# Patient Record
Sex: Female | Born: 1975 | Race: White | Hispanic: No | Marital: Married | Smoking: Never smoker
Health system: Southern US, Community
[De-identification: ages and names within clinical notes are randomized; demographics above are authoritative.]

## PROBLEM LIST (undated history)

## (undated) DIAGNOSIS — F32A Depression, unspecified: Secondary | ICD-10-CM

## (undated) DIAGNOSIS — F329 Major depressive disorder, single episode, unspecified: Secondary | ICD-10-CM

## (undated) DIAGNOSIS — G43909 Migraine, unspecified, not intractable, without status migrainosus: Secondary | ICD-10-CM

## (undated) DIAGNOSIS — D649 Anemia, unspecified: Secondary | ICD-10-CM

## (undated) DIAGNOSIS — E669 Obesity, unspecified: Secondary | ICD-10-CM

## (undated) DIAGNOSIS — J3089 Other allergic rhinitis: Secondary | ICD-10-CM

## (undated) DIAGNOSIS — R519 Headache, unspecified: Secondary | ICD-10-CM

## (undated) DIAGNOSIS — M549 Dorsalgia, unspecified: Secondary | ICD-10-CM

## (undated) DIAGNOSIS — J45909 Unspecified asthma, uncomplicated: Secondary | ICD-10-CM

## (undated) DIAGNOSIS — R569 Unspecified convulsions: Secondary | ICD-10-CM

## (undated) DIAGNOSIS — F319 Bipolar disorder, unspecified: Secondary | ICD-10-CM

## (undated) DIAGNOSIS — S32009A Unspecified fracture of unspecified lumbar vertebra, initial encounter for closed fracture: Secondary | ICD-10-CM

## (undated) DIAGNOSIS — R51 Headache: Secondary | ICD-10-CM

## (undated) DIAGNOSIS — F419 Anxiety disorder, unspecified: Secondary | ICD-10-CM

## (undated) DIAGNOSIS — R011 Cardiac murmur, unspecified: Secondary | ICD-10-CM

## (undated) DIAGNOSIS — G8929 Other chronic pain: Secondary | ICD-10-CM

## (undated) HISTORY — PX: GANGLION CYST EXCISION: SHX1691

## (undated) HISTORY — DX: Migraine, unspecified, not intractable, without status migrainosus: G43.909

## (undated) HISTORY — PX: COLONOSCOPY: SHX174

## (undated) HISTORY — PX: WISDOM TOOTH EXTRACTION: SHX21

## (undated) HISTORY — PX: UPPER GI ENDOSCOPY: SHX6162

## (undated) HISTORY — PX: CHOLECYSTECTOMY: SHX55

---

## 2000-07-23 ENCOUNTER — Other Ambulatory Visit: Admission: RE | Admit: 2000-07-23 | Discharge: 2000-07-23 | Payer: Self-pay | Admitting: Gynecology

## 2000-12-18 ENCOUNTER — Encounter: Payer: Self-pay | Admitting: Family Medicine

## 2000-12-18 ENCOUNTER — Encounter: Admission: RE | Admit: 2000-12-18 | Discharge: 2000-12-18 | Payer: Self-pay | Admitting: Family Medicine

## 2001-01-16 HISTORY — PX: ULNAR NERVE TRANSPOSITION: SHX2595

## 2001-01-24 ENCOUNTER — Encounter: Admission: RE | Admit: 2001-01-24 | Discharge: 2001-01-24 | Payer: Self-pay | Admitting: Family Medicine

## 2001-01-24 ENCOUNTER — Encounter: Payer: Self-pay | Admitting: Family Medicine

## 2001-04-08 ENCOUNTER — Ambulatory Visit (HOSPITAL_BASED_OUTPATIENT_CLINIC_OR_DEPARTMENT_OTHER): Admission: RE | Admit: 2001-04-08 | Discharge: 2001-04-08 | Payer: Self-pay | Admitting: Orthopedic Surgery

## 2001-06-24 ENCOUNTER — Other Ambulatory Visit: Admission: RE | Admit: 2001-06-24 | Discharge: 2001-06-24 | Payer: Self-pay | Admitting: Gynecology

## 2002-08-04 ENCOUNTER — Other Ambulatory Visit: Admission: RE | Admit: 2002-08-04 | Discharge: 2002-08-04 | Payer: Self-pay | Admitting: Obstetrics and Gynecology

## 2003-02-26 ENCOUNTER — Inpatient Hospital Stay (HOSPITAL_COMMUNITY): Admission: AD | Admit: 2003-02-26 | Discharge: 2003-02-28 | Payer: Self-pay | Admitting: Obstetrics and Gynecology

## 2003-04-17 ENCOUNTER — Other Ambulatory Visit: Admission: RE | Admit: 2003-04-17 | Discharge: 2003-04-17 | Payer: Self-pay | Admitting: Obstetrics and Gynecology

## 2005-05-05 ENCOUNTER — Inpatient Hospital Stay (HOSPITAL_COMMUNITY): Admission: AD | Admit: 2005-05-05 | Discharge: 2005-05-05 | Payer: Self-pay | Admitting: Obstetrics and Gynecology

## 2005-05-05 ENCOUNTER — Inpatient Hospital Stay (HOSPITAL_COMMUNITY): Admission: AD | Admit: 2005-05-05 | Discharge: 2005-05-06 | Payer: Self-pay | Admitting: Obstetrics & Gynecology

## 2005-09-14 ENCOUNTER — Encounter (HOSPITAL_COMMUNITY): Admission: AD | Admit: 2005-09-14 | Discharge: 2005-09-15 | Payer: Self-pay | Admitting: *Deleted

## 2005-10-23 ENCOUNTER — Inpatient Hospital Stay (HOSPITAL_COMMUNITY): Admission: AD | Admit: 2005-10-23 | Discharge: 2005-10-26 | Payer: Self-pay | Admitting: Obstetrics and Gynecology

## 2005-10-24 ENCOUNTER — Encounter (INDEPENDENT_AMBULATORY_CARE_PROVIDER_SITE_OTHER): Payer: Self-pay | Admitting: *Deleted

## 2007-01-17 HISTORY — PX: GASTRIC BYPASS: SHX52

## 2009-12-20 ENCOUNTER — Emergency Department (HOSPITAL_COMMUNITY)
Admission: EM | Admit: 2009-12-20 | Discharge: 2009-12-21 | Payer: Self-pay | Source: Home / Self Care | Admitting: Emergency Medicine

## 2010-06-03 NOTE — H&P (Signed)
NAME:  Kelli Barnett, Kelli Barnett                       ACCOUNT NO.:  [id number]   MEDICAL RECORD NO.:  [id number]                   PATIENT TYPE:  INP   LOCATION:  9167                                 FACILITY:  [facility]   PHYSICIAN:  Lenoard Aden, M.D.             DATE OF BIRTH:  06/06/1975   DATE OF ADMISSION:  02/26/2003  DATE OF DISCHARGE:                                HISTORY & PHYSICAL   CHIEF COMPLAINT:  Post dates.   HISTORY OF PRESENT ILLNESS:  The patient is a 35 year old white female, G1,  P0, EDD February 24, 2003, at 40+ weeks for post dates induction with  favorable cervix.   ALLERGIES:  The patient has allergy to NAPROSYN.   MEDICATIONS:  Prenatal vitamins.   PREVIOUS OBSTETRICAL HISTORY:  Noncontributory.   PAST MEDICAL HISTORY:  1. Previous history of epilepsy as a child.  Currently on no medications.  2. History of migraine headaches.   PAST SURGICAL HISTORY:  History of left elbow surgery.   FAMILY HISTORY:  History of depression, diabetes, and heart disease.   PRENATAL LABORATORY DATA:  Blood type O negative.  Rubella immune.  Hepatitis and HIV negative.   PHYSICAL EXAMINATION:  GENERAL APPEARANCE:  A well-developed white female in  no acute distress.  HEENT:  Normal.  LUNGS:  Clear.  HEART:  Regular rhythm.  ABDOMEN:  Soft, gravid, and nontender.  Estimated fetal weight 7-1/2 pounds.  PELVIC:  Cervix 2-3 cm, 2 cm long, vertex, and -1.   IMPRESSION:  Post dates intrauterine pregnancy for induction.   PLAN:  Proceed with induction.  The risks and benefits of expectant  management versus intervention were discussed.                                               Lenoard Aden, M.D.    RJT/MEDQ  D:  02/26/2003  T:  02/26/2003  Job:  [id number]

## 2010-06-03 NOTE — Op Note (Signed)
[place]  Patient:    JENNESSA, TRIGO Visit Number: [id number] MRN: [id number]          Service Type: DSU Location: [facility] Attending Physician:  Milly Jakob Dictated by:   Harvie Junior, M.D. Proc. Date: 04/08/01 Admit Date:  04/08/2001                             Operative Report  PREOPERATIVE DIAGNOSIS:  Ulnar nerve compression, left elbow.  POSTOPERATIVE DIAGNOSIS:  Ulnar nerve compression, left elbow.  OPERATION PERFORMED:  Left ulnar nerve decompression.  SURGEON:  Harvie Junior, M.D.  ASSISTANT:  Currie Paris. Thedore Mins.  ANESTHESIA:  General.  INDICATIONS FOR PROCEDURE:  The patient is a 35 year old female with a long history of having numbness and tingling in her little and ring finger.  She ultimately had failed conservative care and had EMGs performed which showed that she had ulnar nerve compression at the elbow.  Because of failure of conservative care and nerve conduction studies which were positive for ulnar nerve compression, the patient was ultimately evaluated at our office and felt to have ulnar nerve compression at the elbow.  We talked about treatment options including long arm night time splinting versus anti-inflammatory medication, injection therapy.  She really felt that these things were not going to help and that surgical intervention was appropriate.  She was brought to the operating room for that procedure.  DESCRIPTION OF PROCEDURE:  The patient was brought to the operating room and after adequate anesthesia was obtained with a general anesthetic, the patient was placed supine on the operating table.  The left arm was prepped and draped in the usual sterile fashion.  Following Esmarch exsanguination of the extremity, a blood pressure tourniquet was inflated to 250 mHg.  Following this, a linear incision was made over the ulnar nerve with subcutaneous tissues dissected down to the level of the ulnar  nerve which was clearly identified and then dissected both proximally and distally.  Once the nerve was identified and dissected both proximally and distally, the Sewell retractor was put in place proximally and the nerve was decompressed for a 10 cm stretch proximally.  Following the decompression, attention was turned towards distally where the ulnar nerve was identified and decompressed for about 10 cm distally to the elbow.  This was through the flexor carpi ulnaris where the fascia was divided above and the ulnar nerve tunnel was opened at this point.  Following this, the nerve was identified and freed up of all compression.  The nerve was not dissected free and the vessels with the nerve were left undisturbed.  At this point attention was turned to the medial epicondyle where the prominent medial epicondyle was taken down with a subperiosteal dissection.  A medial epicondylectomy was performed.  Once the medial epicondylectomy was performed, the ulnar nerve no longer had compression when the elbow was put through a range of motion.  At this point the wound was copiously irrigated and suctioned dry.  The medial musculature was then closed over the subperiosteal dissection and where the bone had been removed.  At that point the elbow was put through a range of motion and there was no evidence of compression after closure of this muscle fascia.  At this point the wound was copiously irrigated.  The deep subcutaneous was closed with 0 Vicryl, the subcutaneous with 2-0 Vicryl and the skin with 3-0  Maxon pull-out suture.  Benzoin and Steri-Strips were applied.  Sterile compressive dressing was applied.  The patient was transferred to the recovery room where she was noted to be in satisfactory condition.  Estimated blood loss for this procedure was none. Dictated by:   Harvie Junior, M.D. Attending Physician:  Milly Jakob DD:  04/08/01 TD:  04/09/01 Job: [id number]

## 2010-06-03 NOTE — H&P (Signed)
Kelli Barnett, Kelli Barnett             ACCOUNT NO.:  [id number]   MEDICAL RECORD NO.:  [id number]          PATIENT TYPE:  INP   LOCATION:  9174                          FACILITY:  [facility]   PHYSICIAN:  Lenoard Aden, M.D.DATE OF BIRTH:  [birth year]   DATE OF ADMISSION:  10/23/2005  DATE OF DISCHARGE:                                HISTORY & PHYSICAL   CHIEF COMPLAINT:  Oligohydramnios with history of second and third trimester  bleeding, for cervical ripening and induction.   PAST MEDICAL HISTORY:  The patient is a 35 year old white female, G2, P1, at  [redacted] weeks gestation who presents for induction for the aforementioned  reasons.  She has a history during this pregnancy of a large subchorionic  hemorrhagic with subsequent resolution, history of placenta previa with  resolution, history of group B strep positivity, history of unexplained  oligohydramnios with adequate growth, normal targeted ultrasound.  She did  receive betamethasone in the early third trimester.  She has had reassuring  fetal heart rate surveillance and biophysical profiles twice weekly.   ALLERGIES:  NAPROSYN.   FAMILY HISTORY:  Myocardial infarction, chronic hypertension, polycythemia,  bronchitis, breast cancer, leukemia, seizure disorder, migraine headaches,  depression, nicotine dependence, alcohol abuse.   PAST MEDICAL HISTORY:  She has a personal history of:  1. Arm surgery.  2. Anemia as a child.  3. Childhood seizure disorder.  4. Migraine headaches.   SOCIAL HISTORY:  She is a nonsmoker, nondrinker.  She denies domestic or  physical violence.   OBSTETRIC HISTORY:  History of uncomplicated vaginal delivery of  7 pound,  10 ounce female vaginally without complication in 2005.   PHYSICAL EXAMINATION:  GENERAL:  She is a well-developed, well-nourished  white female in no acute distress.  HEENT:  Normal.  LUNGS:  Clear.  HEART:  Rhythm regular.  ABDOMEN:  Soft, gravid, nontender.  Cervix is closed, 50%,  vertex, -1.  EXTREMITIES:  No cords.  NEUROLOGIC:  Nonfocal.   IMPRESSION:  1. Thirty-seven week OB.  2. Unexplained oligohydramnios.  3. History of second and third trimester bleeding.  4. GBS positivity.   PLAN:  Cervidil placed.  NST reactive.  Will proceed with cervical ripening  and induction.      Lenoard Aden, M.D.  Electronically Signed     RJT/MEDQ  D:  10/23/2005  T:  10/23/2005  Job:  [id number]

## 2012-02-29 ENCOUNTER — Encounter (HOSPITAL_BASED_OUTPATIENT_CLINIC_OR_DEPARTMENT_OTHER): Payer: Self-pay | Admitting: Emergency Medicine

## 2012-02-29 ENCOUNTER — Emergency Department (HOSPITAL_BASED_OUTPATIENT_CLINIC_OR_DEPARTMENT_OTHER): Payer: BC Managed Care – PPO

## 2012-02-29 ENCOUNTER — Emergency Department (HOSPITAL_BASED_OUTPATIENT_CLINIC_OR_DEPARTMENT_OTHER)
Admission: EM | Admit: 2012-02-29 | Discharge: 2012-02-29 | Disposition: A | Discharge status: Home / Self Care | Payer: BC Managed Care – PPO | Attending: Emergency Medicine | Admitting: Emergency Medicine

## 2012-02-29 DIAGNOSIS — F3289 Other specified depressive episodes: Secondary | ICD-10-CM | POA: Insufficient documentation

## 2012-02-29 DIAGNOSIS — Z79899 Other long term (current) drug therapy: Secondary | ICD-10-CM | POA: Insufficient documentation

## 2012-02-29 DIAGNOSIS — X500XXA Overexertion from strenuous movement or load, initial encounter: Secondary | ICD-10-CM | POA: Insufficient documentation

## 2012-02-29 DIAGNOSIS — Z8781 Personal history of (healed) traumatic fracture: Secondary | ICD-10-CM | POA: Insufficient documentation

## 2012-02-29 DIAGNOSIS — Y9389 Activity, other specified: Secondary | ICD-10-CM | POA: Insufficient documentation

## 2012-02-29 DIAGNOSIS — Z9884 Bariatric surgery status: Secondary | ICD-10-CM | POA: Insufficient documentation

## 2012-02-29 DIAGNOSIS — Y929 Unspecified place or not applicable: Secondary | ICD-10-CM | POA: Insufficient documentation

## 2012-02-29 DIAGNOSIS — S8990XA Unspecified injury of unspecified lower leg, initial encounter: Secondary | ICD-10-CM | POA: Insufficient documentation

## 2012-02-29 DIAGNOSIS — M79673 Pain in unspecified foot: Secondary | ICD-10-CM

## 2012-02-29 DIAGNOSIS — F329 Major depressive disorder, single episode, unspecified: Secondary | ICD-10-CM | POA: Insufficient documentation

## 2012-02-29 HISTORY — DX: Unspecified fracture of unspecified lumbar vertebra, initial encounter for closed fracture: S32.009A

## 2012-02-29 HISTORY — DX: Depression, unspecified: F32.A

## 2012-02-29 HISTORY — DX: Major depressive disorder, single episode, unspecified: F32.9

## 2012-02-29 MED ORDER — HYDROCODONE-ACETAMINOPHEN 5-325 MG PO TABS: 1.0000 | ORAL_TABLET | ORAL | Status: DC | PRN

## 2012-02-29 NOTE — ED Provider Notes (Signed)
History     CSN: [id number]  Arrival date & time 02/29/12  1228   First MD Initiated Contact with Patient 02/29/12 1240      Chief Complaint  Patient presents with  . Foot Pain    (Consider location/radiation/quality/duration/timing/severity/associated sxs/prior treatment) HPI Comments: Pt states that she twisted foot and now she is having pain and swelling to the right lateral foot  Patient is a 37 y.o. female presenting with lower extremity pain. The history is provided by the patient. No language interpreter was used.  Foot Pain This is a new problem. The current episode started today. The problem occurs constantly. The problem has been unchanged. The symptoms are aggravated by walking. She has tried nothing for the symptoms.    Past Medical History  Diagnosis Date  . Depression   . Lumbar vertebral fracture     Past Surgical History  Procedure Laterality Date  . Gastric bypass  2009  . Ulnar nerve transposition    . Cholecystectomy      History reviewed. No pertinent family history.  History  Substance Use Topics  . Smoking status: Never Smoker   . Smokeless tobacco: Not on file  . Alcohol Use: Yes     Comment: occassional    OB History   Grav Para Term Preterm Abortions TAB SAB Ect Mult Living                  Review of Systems  Constitutional: Negative.   Respiratory: Negative.   Cardiovascular: Negative.     Allergies  Oxycodone and Naproxen  Home Medications   Current Outpatient Rx  Name  Route  Sig  Dispense  Refill  . ARIPiprazole (ABILIFY) 20 MG tablet   Oral   Take 10 mg by mouth daily.         . norgestrel-ethinyl estradiol (LO/OVRAL,CRYSELLE) 0.3-30 MG-MCG tablet   Oral   Take 1 tablet by mouth daily.         . Vilazodone HCl (VIIBRYD) 40 MG TABS   Oral   Take 40 mg by mouth daily.           BP 143/88  Pulse 86  Temp(Src) 98.3 F (36.8 C)  Resp 16  Ht 5\' 10"  (1.778 m)  Wt 240 lb (108.863 kg)  BMI 34.44 kg/m2   SpO2 100%  LMP 02/22/2012  Physical Exam  Vitals reviewed. Constitutional: She is oriented to person, place, and time. She appears well-developed and well-nourished.  Cardiovascular: Normal rate and regular rhythm.   Pulmonary/Chest: Effort normal and breath sounds normal.  Musculoskeletal:  Pt has swelling along the right lateral foot:pt tender along the fifth metatarsal:pulses intact  Neurological: She is alert and oriented to person, place, and time.  Skin: Skin is warm and dry.  Psychiatric: She has a normal mood and affect.    ED Course  Procedures (including critical care time)  Labs Reviewed - No data to display Dg Foot Complete Right  02/29/2012  *RADIOLOGY REPORT*  Clinical Data: Pain post fall  RIGHT FOOT COMPLETE - 3+ VIEW  Comparison: None.  Findings: Three views of the right foot submitted.  No acute fracture or subluxation.  No radiopaque foreign body.  IMPRESSION: No acute fracture or subluxation.   Original Report Authenticated By: Natasha Mead, M.D.      1. Foot pain       MDM  Pt splinted and given crutches for a possible metatarsal fracture  Teressa Lower, NP 02/29/12 1354

## 2012-02-29 NOTE — ED Provider Notes (Signed)
History/physical exam/procedure(s) were performed by non-physician practitioner and as supervising physician I was immediately available for consultation/collaboration. I have reviewed all notes and am in agreement with care and plan.   Hilario Quarry, MD 02/29/12 [phone]

## 2012-02-29 NOTE — ED Notes (Signed)
Patient transported to X-ray 

## 2012-02-29 NOTE — ED Notes (Signed)
Pt's right foot fell asleep while sitting, stood and "twisted" foot/ankle. Pt has lateral swelling and ecchymosis to dorsum of foot and reports pain in foot that is worse with ambulation.

## 2013-01-16 HISTORY — PX: BACK SURGERY: SHX140

## 2013-04-16 ENCOUNTER — Encounter (HOSPITAL_BASED_OUTPATIENT_CLINIC_OR_DEPARTMENT_OTHER): Payer: Self-pay | Admitting: Emergency Medicine

## 2013-04-16 ENCOUNTER — Emergency Department (HOSPITAL_BASED_OUTPATIENT_CLINIC_OR_DEPARTMENT_OTHER): Payer: BC Managed Care – PPO

## 2013-04-16 ENCOUNTER — Emergency Department (HOSPITAL_BASED_OUTPATIENT_CLINIC_OR_DEPARTMENT_OTHER)
Admission: EM | Admit: 2013-04-16 | Discharge: 2013-04-16 | Disposition: A | Discharge status: Home / Self Care | Payer: BC Managed Care – PPO | Attending: Emergency Medicine | Admitting: Emergency Medicine

## 2013-04-16 DIAGNOSIS — R51 Headache: Secondary | ICD-10-CM

## 2013-04-16 DIAGNOSIS — Z8781 Personal history of (healed) traumatic fracture: Secondary | ICD-10-CM | POA: Insufficient documentation

## 2013-04-16 DIAGNOSIS — R519 Headache, unspecified: Secondary | ICD-10-CM

## 2013-04-16 DIAGNOSIS — F329 Major depressive disorder, single episode, unspecified: Secondary | ICD-10-CM | POA: Insufficient documentation

## 2013-04-16 DIAGNOSIS — Z79899 Other long term (current) drug therapy: Secondary | ICD-10-CM | POA: Insufficient documentation

## 2013-04-16 DIAGNOSIS — R200 Anesthesia of skin: Secondary | ICD-10-CM

## 2013-04-16 DIAGNOSIS — H538 Other visual disturbances: Secondary | ICD-10-CM | POA: Insufficient documentation

## 2013-04-16 DIAGNOSIS — R209 Unspecified disturbances of skin sensation: Secondary | ICD-10-CM | POA: Insufficient documentation

## 2013-04-16 DIAGNOSIS — G43109 Migraine with aura, not intractable, without status migrainosus: Secondary | ICD-10-CM | POA: Insufficient documentation

## 2013-04-16 DIAGNOSIS — R0789 Other chest pain: Secondary | ICD-10-CM | POA: Insufficient documentation

## 2013-04-16 DIAGNOSIS — F3289 Other specified depressive episodes: Secondary | ICD-10-CM | POA: Insufficient documentation

## 2013-04-16 DIAGNOSIS — Z3202 Encounter for pregnancy test, result negative: Secondary | ICD-10-CM | POA: Insufficient documentation

## 2013-04-16 LAB — PREGNANCY, URINE: Preg Test, Ur: NEGATIVE

## 2013-04-16 LAB — BASIC METABOLIC PANEL
BUN: 8 mg/dL (ref 6–23)
CO2: 25 mEq/L (ref 19–32)
Calcium: 9 mg/dL (ref 8.4–10.5)
Chloride: 102 mEq/L (ref 96–112)
Creatinine, Ser: 0.6 mg/dL (ref 0.50–1.10)
GFR calc Af Amer: >90 mL/min (ref >90)
GFR calc non Af Amer: >90 mL/min (ref >90)
Glucose, Bld: 98 mg/dL (ref 70–99)
Potassium: 3.7 mEq/L (ref 3.7–5.3)
Sodium: 139 mEq/L (ref 137–147)

## 2013-04-16 LAB — CBC WITH DIFFERENTIAL/PLATELET
Basophils Absolute: 0.1 10*3/uL (ref 0.0–0.1)
Basophils Relative: 1 % (ref 0–1)
Eosinophils Absolute: 0.1 10*3/uL (ref 0.0–0.7)
Eosinophils Relative: 2 % (ref 0–5)
HCT: 32.6 % — ABNORMAL LOW (ref 36.0–46.0)
Hemoglobin: 10.2 g/dL — ABNORMAL LOW (ref 12.0–15.0)
Lymphocytes Relative: 30 % (ref 12–46)
Lymphs Abs: 2 10*3/uL (ref 0.7–4.0)
MCH: 27.3 pg (ref 26.0–34.0)
MCHC: 31.3 g/dL (ref 30.0–36.0)
MCV: 87.4 fL (ref 78.0–100.0)
Monocytes Absolute: 0.6 10*3/uL (ref 0.1–1.0)
Monocytes Relative: 9 % (ref 3–12)
Neutro Abs: 3.9 10*3/uL (ref 1.7–7.7)
Neutrophils Relative %: 59 % (ref 43–77)
Platelets: 406 10*3/uL — ABNORMAL HIGH (ref 150–400)
RBC: 3.73 MIL/uL — ABNORMAL LOW (ref 3.87–5.11)
RDW: 17 % — ABNORMAL HIGH (ref 11.5–15.5)
WBC: 6.6 10*3/uL (ref 4.0–10.5)

## 2013-04-16 LAB — URINALYSIS, ROUTINE W REFLEX MICROSCOPIC
Bilirubin Urine: NEGATIVE
Glucose, UA: NEGATIVE mg/dL
Hgb urine dipstick: NEGATIVE
Ketones, ur: 15 mg/dL — AB
Nitrite: NEGATIVE
Protein, ur: NEGATIVE mg/dL
Specific Gravity, Urine: 1.019 (ref 1.005–1.030)
Urobilinogen, UA: 0.2 mg/dL (ref 0.0–1.0)
pH: 6.5 (ref 5.0–8.0)

## 2013-04-16 LAB — URINE MICROSCOPIC-ADD ON

## 2013-04-16 MED ORDER — DIPHENHYDRAMINE HCL 50 MG/ML IJ SOLN
25.0000 mg | Freq: Once | INTRAMUSCULAR | Status: AC
  Administered 2013-04-16: 25 mg via INTRAVENOUS
  Filled 2013-04-16: qty 1

## 2013-04-16 MED ORDER — METOCLOPRAMIDE HCL 5 MG/ML IJ SOLN
10.0000 mg | Freq: Once | INTRAMUSCULAR | Status: AC
  Administered 2013-04-16: 10 mg via INTRAVENOUS
  Filled 2013-04-16: qty 2

## 2013-04-16 MED ORDER — DEXAMETHASONE SODIUM PHOSPHATE 10 MG/ML IJ SOLN
10.0000 mg | Freq: Once | INTRAMUSCULAR | Status: AC
  Administered 2013-04-16: 10 mg via INTRAVENOUS
  Filled 2013-04-16: qty 1

## 2013-04-16 NOTE — ED Provider Notes (Addendum)
CSN: [id number]     Arrival date & time 04/16/13  1606 History  This chart was scribed for Gwyneth Sprout, MD by Charline Bills, ED Scribe. The patient was seen in room MH12/MH12. Patient's care was started at 6:21 PM.   Chief Complaint  Patient presents with  . Numbness    The history is provided by the patient. No language interpreter was used.   HPI Comments: Kelli Barnett is a 38 y.o. female who presents to the Emergency Department complaining of intermittent numbness in her left hand that radiates to her left shoulder onset 2 weeks ago. She states that this has occurred approximately 6 times over the past 2 weeks and usually would last about 1 hour but today started at 11am and has not resolved over the last 5.5 hours.  She has had a headache every time these sx have occurred. She also reports associated headaches that she rates 4/10 sometimes and describes as "stabbing" at other times. She also reports blurred vision but denies double vision.  No lower ext involvement. She denies chest pain and shortness of breath, but reports some numbness in her left shoulder area.  No weakness. LNMP 04/04/13.   Past Medical History  Diagnosis Date  . Depression   . Lumbar vertebral fracture    Past Surgical History  Procedure Laterality Date  . Gastric bypass  2009  . Ulnar nerve transposition    . Cholecystectomy     History reviewed. No pertinent family history. History  Substance Use Topics  . Smoking status: Never Smoker   . Smokeless tobacco: Not on file  . Alcohol Use: Yes     Comment: occassional   OB History   Grav Para Term Preterm Abortions TAB SAB Ect Mult Living                 Review of Systems  Eyes: Positive for visual disturbance.  Respiratory: Positive for chest tightness. Negative for shortness of breath.   Cardiovascular: Negative for chest pain.  Neurological: Positive for numbness and headaches.  All other systems reviewed and are negative.    Allergies   Tussionex pennkinetic er and Naproxen  Home Medications   Current Outpatient Rx  Name  Route  Sig  Dispense  Refill  . ARIPiprazole (ABILIFY) 20 MG tablet   Oral   Take 10 mg by mouth daily.         Marland Kitchen HYDROcodone-acetaminophen (NORCO/VICODIN) 5-325 MG per tablet   Oral   Take 1-2 tablets by mouth every 4 (four) hours as needed for pain.   15 tablet   0   . norgestrel-ethinyl estradiol (LO/OVRAL,CRYSELLE) 0.3-30 MG-MCG tablet   Oral   Take 1 tablet by mouth daily.         . Vilazodone HCl (VIIBRYD) 40 MG TABS   Oral   Take 40 mg by mouth daily.          Triage Vitals: BP 180/103  Temp(Src) 97.9 F (36.6 C) (Oral)  Resp 16  Ht 5\' 9"  (1.753 m)  Wt 240 lb (108.863 kg)  BMI 35.43 kg/m2  SpO2 100%  LMP 04/04/2013  Physical Exam  Nursing note and vitals reviewed. Constitutional: She appears well-developed and well-nourished. No distress.  HENT:  Head: Normocephalic and atraumatic.  Eyes: Conjunctivae and EOM are normal. Pupils are equal, round, and reactive to light.  photophobia  Neck: Neck supple.  No carotid bruits   Cardiovascular: Normal rate, normal heart sounds and intact distal pulses.  No murmur heard. Pulmonary/Chest: Breath sounds normal. No respiratory distress. She has no wheezes. She has no rales.  Abdominal: Soft.  Neurological: She is alert. She has normal strength. A sensory deficit is present. Coordination and gait normal.  Decreases sensation to touch over the L facial nerve and L upper extremity  5/5 strength in all 4 extremities No facial droop Normal speech  No swallowing issues  Skin: She is not diaphoretic.    ED Course  Procedures (including critical care time) DIAGNOSTIC STUDIES: Oxygen Saturation is 100% on RA, normal by my interpretation.    COORDINATION OF CARE: 6:29 PM-Discussed treatment plan pt at bedside and pt agreed to plan.   Labs Review Labs Reviewed  CBC WITH DIFFERENTIAL - Abnormal; Notable for the following:     RBC 3.73 (*)    Hemoglobin 10.2 (*)    HCT 32.6 (*)    RDW 17.0 (*)    Platelets 406 (*)    All other components within normal limits  URINALYSIS, ROUTINE W REFLEX MICROSCOPIC - Abnormal; Notable for the following:    APPearance CLOUDY (*)    Ketones, ur 15 (*)    Leukocytes, UA SMALL (*)    All other components within normal limits  URINE MICROSCOPIC-ADD ON - Abnormal; Notable for the following:    Squamous Epithelial / LPF FEW (*)    Bacteria, UA MANY (*)    All other components within normal limits  BASIC METABOLIC PANEL  PREGNANCY, URINE   Imaging Review Ct Head Wo Contrast  04/16/2013   CLINICAL DATA:  Intermittent numbness in left hand.  Blurry vision.  EXAM: CT HEAD WITHOUT CONTRAST  TECHNIQUE: Contiguous axial images were obtained from the base of the skull through the vertex without intravenous contrast.  COMPARISON:  None.  FINDINGS: There is no evidence for acute hemorrhage, hydrocephalus, mass lesion, or abnormal extra-axial fluid collection. No definite CT evidence for acute infarction. The visualized paranasal sinuses and mastoid air cells are clear.  IMPRESSION: Normal exam.   Electronically Signed   By: Kennith Center M.D.   On: 04/16/2013 20:22     EKG Interpretation   Date/Time:  Wednesday April 16 2013 16:43:29 EDT Ventricular Rate:  86 PR Interval:  160 QRS Duration: 98 QT Interval:  388 QTC Calculation: 464 R Axis:   32 Text Interpretation:  Normal sinus rhythm Normal ECG No previous tracing  Confirmed by Anitra Lauth  MD, Alphonzo Lemmings ([id number]) on 04/16/2013 6:18:48 PM      MDM   Final diagnoses:  Headache  Numbness  Complicated migraine    Patient with symptoms concerning for possible complex migraine versus stroke versus intracranial lesion versus bleed. For the last 2 weeks she's had intermittent symptoms that have been prolonged today. The symptoms are always associated with the headache. She has decreased sensation over the area specified in the physical  exam but no weakness noted on exam.  Patient seen in urgent care today and sent here. She is photophobic and is complaining of a headache. Prior history of migraines but no history of complex migraines. Patient given a headache cocktail. Initial blood pressure was elevated at 180/103 and patient states usually she has low blood pressure. We'll recheck. EKG within normal limits. Head CT, CBC, BMP, UA, UPT pending  7:59 PM Repeat BP was 127/55 without intervention and feel first number was an error.   9:12 PM Labs and Ct neg.  Pt sx are much improved after HA cocktail.  Pt to f/u with  neuro or her PCP if sx continue for MRI.  I personally performed the services described in this documentation, which was scribed in my presence.  The recorded information has been reviewed and considered.    Gwyneth SproutWhitney Azilee Pirro, MD 04/16/13 2112  Gwyneth SproutWhitney Tarrin Menn, MD 04/16/13 2115

## 2013-04-16 NOTE — ED Notes (Signed)
[facility]. Pt reports intermittent left hand numbness radiating to left shoulder, left neck, left jaw, through left eye, all starting 2 weeks ago. Went to [facility] today d/t increased pain to left temporal area. Pt reports headache. Tightness across left chest. Denies fever chills, n/v/d.

## 2013-04-16 NOTE — ED Notes (Signed)
Pt denies urge to void at this time.

## 2014-01-04 ENCOUNTER — Encounter (HOSPITAL_BASED_OUTPATIENT_CLINIC_OR_DEPARTMENT_OTHER): Payer: Self-pay | Admitting: *Deleted

## 2014-01-04 ENCOUNTER — Emergency Department (HOSPITAL_BASED_OUTPATIENT_CLINIC_OR_DEPARTMENT_OTHER)
Admission: EM | Admit: 2014-01-04 | Discharge: 2014-01-04 | Disposition: A | Discharge status: Home / Self Care | Payer: BC Managed Care – PPO | Attending: Emergency Medicine | Admitting: Emergency Medicine

## 2014-01-04 DIAGNOSIS — M5441 Lumbago with sciatica, right side: Secondary | ICD-10-CM | POA: Insufficient documentation

## 2014-01-04 DIAGNOSIS — Z79899 Other long term (current) drug therapy: Secondary | ICD-10-CM | POA: Diagnosis not present

## 2014-01-04 DIAGNOSIS — Z3202 Encounter for pregnancy test, result negative: Secondary | ICD-10-CM | POA: Insufficient documentation

## 2014-01-04 DIAGNOSIS — Z7952 Long term (current) use of systemic steroids: Secondary | ICD-10-CM | POA: Insufficient documentation

## 2014-01-04 DIAGNOSIS — R112 Nausea with vomiting, unspecified: Secondary | ICD-10-CM | POA: Diagnosis present

## 2014-01-04 DIAGNOSIS — Z8781 Personal history of (healed) traumatic fracture: Secondary | ICD-10-CM | POA: Insufficient documentation

## 2014-01-04 LAB — BASIC METABOLIC PANEL
Anion gap: 16 — ABNORMAL HIGH (ref 5–15)
BUN: 8 mg/dL (ref 6–23)
CO2: 24 mEq/L (ref 19–32)
Calcium: 8.8 mg/dL (ref 8.4–10.5)
Chloride: 99 mEq/L (ref 96–112)
Creatinine, Ser: 0.7 mg/dL (ref 0.50–1.10)
GFR calc Af Amer: >90 mL/min (ref >90)
GFR calc non Af Amer: >90 mL/min (ref >90)
Glucose, Bld: 94 mg/dL (ref 70–99)
Potassium: 4.2 mEq/L (ref 3.7–5.3)
Sodium: 139 mEq/L (ref 137–147)

## 2014-01-04 LAB — CBC WITH DIFFERENTIAL/PLATELET
Basophils Absolute: 0 10*3/uL (ref 0.0–0.1)
Basophils Relative: 1 % (ref 0–1)
Eosinophils Absolute: 0 10*3/uL (ref 0.0–0.7)
Eosinophils Relative: 1 % (ref 0–5)
HCT: 34.2 % — ABNORMAL LOW (ref 36.0–46.0)
Hemoglobin: 10.2 g/dL — ABNORMAL LOW (ref 12.0–15.0)
Lymphocytes Relative: 30 % (ref 12–46)
Lymphs Abs: 2.2 10*3/uL (ref 0.7–4.0)
MCH: 23.4 pg — ABNORMAL LOW (ref 26.0–34.0)
MCHC: 29.8 g/dL — ABNORMAL LOW (ref 30.0–36.0)
MCV: 78.6 fL (ref 78.0–100.0)
Monocytes Absolute: 0.6 10*3/uL (ref 0.1–1.0)
Monocytes Relative: 8 % (ref 3–12)
Neutro Abs: 4.4 10*3/uL (ref 1.7–7.7)
Neutrophils Relative %: 60 % (ref 43–77)
Platelets: 553 10*3/uL — ABNORMAL HIGH (ref 150–400)
RBC: 4.35 MIL/uL (ref 3.87–5.11)
RDW: 20.2 % — ABNORMAL HIGH (ref 11.5–15.5)
WBC: 7.2 10*3/uL (ref 4.0–10.5)

## 2014-01-04 LAB — URINALYSIS, ROUTINE W REFLEX MICROSCOPIC
Bilirubin Urine: NEGATIVE
Glucose, UA: NEGATIVE mg/dL
Hgb urine dipstick: NEGATIVE
Ketones, ur: 15 mg/dL — AB
Leukocytes, UA: NEGATIVE
Nitrite: NEGATIVE
Protein, ur: NEGATIVE mg/dL
Specific Gravity, Urine: 1.024 (ref 1.005–1.030)
Urobilinogen, UA: 1 mg/dL (ref 0.0–1.0)
pH: 5.5 (ref 5.0–8.0)

## 2014-01-04 LAB — PREGNANCY, URINE: Preg Test, Ur: NEGATIVE

## 2014-01-04 MED ORDER — HYDROMORPHONE HCL 1 MG/ML IJ SOLN
1.0000 mg | Freq: Once | INTRAMUSCULAR | Status: AC
  Administered 2014-01-04: 1 mg via INTRAVENOUS
  Filled 2014-01-04: qty 1

## 2014-01-04 MED ORDER — DEXAMETHASONE SODIUM PHOSPHATE 10 MG/ML IJ SOLN
10.0000 mg | Freq: Once | INTRAMUSCULAR | Status: AC
  Administered 2014-01-04: 10 mg via INTRAVENOUS
  Filled 2014-01-04: qty 1

## 2014-01-04 MED ORDER — HYDROMORPHONE HCL 1 MG/ML IJ SOLN
1.0000 mg | Freq: Once | INTRAMUSCULAR | Status: AC
Start: 2014-01-04 — End: 2014-01-04
  Administered 2014-01-04: 1 mg via INTRAVENOUS
  Filled 2014-01-04: qty 1

## 2014-01-04 MED ORDER — PREDNISONE 10 MG PO TABS: 20.0000 mg | ORAL_TABLET | Freq: Every day | ORAL | Status: DC

## 2014-01-04 MED ORDER — SODIUM CHLORIDE 0.9 % IV BOLUS (SEPSIS)
1000.0000 mL | Freq: Once | INTRAVENOUS | Status: AC
  Administered 2014-01-04: 1000 mL via INTRAVENOUS

## 2014-01-04 MED ORDER — ONDANSETRON HCL 4 MG/2ML IJ SOLN
4.0000 mg | Freq: Once | INTRAMUSCULAR | Status: AC
  Administered 2014-01-04: 4 mg via INTRAVENOUS
  Filled 2014-01-04: qty 2

## 2014-01-04 NOTE — ED Notes (Signed)
Belfi MD at bedside. 

## 2014-01-04 NOTE — ED Provider Notes (Signed)
CSN: [id number]     Arrival date & time 01/04/14  1709 History  This chart was scribed for Kelli BuccoMelanie Rance Smithson, MD by Evon Slackerrance Branch, ED Scribe. This patient was seen in room MH05/MH05 and the patient's care was started at 5:33 PM.      Chief Complaint  Patient presents with  . Emesis   Patient is a 38 y.o. female presenting with back pain. The history is provided by the patient. No language interpreter was used.  Back Pain Location:  Lumbar spine Radiates to:  R foot, R knee and R posterior upper leg Pain severity:  Moderate Onset quality:  Gradual Duration:  2 days Timing:  Constant Progression:  Worsening Relieved by:  Nothing Worsened by:  Movement Ineffective treatments:  Muscle relaxants, NSAIDs and narcotics Associated symptoms: no abdominal pain, no chest pain, no fever, no headaches, no numbness and no weakness   Risk factors: recent surgery    HPI Comments: Kelli Barnett is a 38 y.o. female with PMHx of chronic back pain s/p prior back surgery by Dr. Danielle Barnett in September who presents to the Emergency Department complaining of vomiting onset 2 days prior. Pt states that she believes that the vomiting is associated with her back pain. Pt states that she recently had back surgery in September. Pt states that back pain has been progressively worsening over the past 2 days. Pt states she had surgery on the L5 and S1 for Hx of ruptured disc. Pt states the pain is mostly on her right side and radiates down her right leg all the way into her toes.  Pt states that any movement makes her pain worse. Pt states that when walking her pain is not as bad. Pt denies fever, cough, congestion, numbness or bowel/bladder incontinence. Pt states she has been taking percocet, valium and mobic for control of her pain and inflammation that hasn't provided any relief lately.  Pt states she has an appointment in Jan with her neuro surgeon and thinks she may have to have another surgery.  Pt states she was  evaluated at urgent care and was told to come to the ED for her hemoglobin being low.      Past Medical History  Diagnosis Date  . Depression   . Lumbar vertebral fracture    Past Surgical History  Procedure Laterality Date  . Gastric bypass  2009  . Ulnar nerve transposition    . Cholecystectomy     History reviewed. No pertinent family history. History  Substance Use Topics  . Smoking status: Never Smoker   . Smokeless tobacco: Not on file  . Alcohol Use: Yes     Comment: occassional   OB History    No data available     Review of Systems  Constitutional: Negative for fever, chills, diaphoresis and fatigue.  HENT: Negative for congestion, rhinorrhea and sneezing.   Eyes: Negative.   Respiratory: Negative for cough, chest tightness and shortness of breath.   Cardiovascular: Negative for chest pain and leg swelling.  Gastrointestinal: Positive for nausea and vomiting. Negative for abdominal pain, diarrhea and blood in stool.  Genitourinary: Negative.  Negative for frequency, hematuria, flank pain and difficulty urinating.  Musculoskeletal: Positive for back pain. Negative for arthralgias.  Skin: Negative for rash.  Neurological: Positive for dizziness. Negative for speech difficulty, weakness, numbness and headaches.      Allergies  Tussionex pennkinetic er and Naproxen  Home Medications   Prior to Admission medications   Medication Sig Start  Date End Date Taking? Authorizing Provider  norgestrel-ethinyl estradiol (LO/OVRAL,CRYSELLE) 0.3-30 MG-MCG tablet Take 1 tablet by mouth daily.    Historical Provider, MD  predniSONE (DELTASONE) 10 MG tablet Take 2 tablets (20 mg total) by mouth daily. 01/04/14   Kelli BuccoMelanie Kelli Brackin, MD  Vilazodone HCl (VIIBRYD) 40 MG TABS Take 40 mg by mouth daily.    Historical Provider, MD   Triage Vitals: BP 136/89 mmHg  Pulse 116  Temp(Src) 98.5 F (36.9 C)  Resp 18  Ht 5\' 10"  (1.778 m)  Wt 238 lb (107.956 kg)  BMI 34.15 kg/m2  SpO2  100%  LMP 12/21/2013  Physical Exam  Constitutional: She is oriented to person, place, and time. She appears well-developed and well-nourished.  HENT:  Head: Normocephalic and atraumatic.  Eyes: Pupils are equal, round, and reactive to light.  Neck: Normal range of motion. Neck supple.  Cardiovascular: Normal rate, regular rhythm and normal heart sounds.   Pulmonary/Chest: Effort normal and breath sounds normal. No respiratory distress. She has no wheezes. She has no rales. She exhibits no tenderness.  Abdominal: Soft. Bowel sounds are normal. There is no tenderness. There is no rebound and no guarding.  Musculoskeletal: Normal range of motion. She exhibits no edema.  +tenderness to right lower lumbar area and over right sciatic nerve.  +SLR on right.  Patellar reflexes symmetric.  Normal motor function and sensation to the lower extremities.  Lymphadenopathy:    She has no cervical adenopathy.  Neurological: She is alert and oriented to person, place, and time.  Skin: Skin is warm and dry. No rash noted.  Psychiatric: She has a normal mood and affect.    ED Course  Procedures (including critical care time) DIAGNOSTIC STUDIES: Oxygen Saturation is 100% on RA, normal by my interpretation.    COORDINATION OF CARE: 5:50 PM-Discussed treatment plan with pt at bedside and pt agreed to plan.     Labs Review Labs Reviewed  BASIC METABOLIC PANEL - Abnormal; Notable for the following:    Anion gap 16 (*)    All other components within normal limits  CBC WITH DIFFERENTIAL - Abnormal; Notable for the following:    Hemoglobin 10.2 (*)    HCT 34.2 (*)    MCH 23.4 (*)    MCHC 29.8 (*)    RDW 20.2 (*)    Platelets 553 (*)    All other components within normal limits  URINALYSIS, ROUTINE W REFLEX MICROSCOPIC - Abnormal; Notable for the following:    APPearance CLOUDY (*)    Ketones, ur 15 (*)    All other components within normal limits  PREGNANCY, URINE    Imaging Review No  results found.   EKG Interpretation None      MDM   Final diagnoses:  Right-sided low back pain with right-sided sciatica    Pt's hgb is at baseline.  Pain controlled after dilaudid.  Pain is consistent with her past back pain, although worse.  No neuro dysfunction or signs of cauda equina.  Pt to continue home meds and f/u with her neurosurgeon.   I personally performed the services described in this documentation, which was scribed in my presence.  The recorded information has been reviewed and considered.      Kelli BuccoMelanie Tyanne Derocher, MD 01/04/14 [phone]

## 2014-01-04 NOTE — Discharge Instructions (Signed)
Back Pain, Adult °Back pain is very common. The pain often gets better over time. The cause of back pain is usually not dangerous. Most people can learn to manage their back pain on their own.  °HOME CARE  °· Stay active. Start with short walks on flat ground if you can. Try to walk farther each day. °· Do not sit, drive, or stand in one place for more than 30 minutes. Do not stay in bed. °· Do not avoid exercise or work. Activity can help your back heal faster. °· Be careful when you bend or lift an object. Bend at your knees, keep the object close to you, and do not twist. °· Sleep on a firm mattress. Lie on your side, and bend your knees. If you lie on your back, put a pillow under your knees. °· Only take medicines as told by your doctor. °· Put ice on the injured area. °¨ Put ice in a plastic bag. °¨ Place a towel between your skin and the bag. °¨ Leave the ice on for 15-20 minutes, 03-04 times a day for the first 2 to 3 days. After that, you can switch between ice and heat packs. °· Ask your doctor about back exercises or massage. °· Avoid feeling anxious or stressed. Find good ways to deal with stress, such as exercise. °GET HELP RIGHT AWAY IF:  °· Your pain does not go away with rest or medicine. °· Your pain does not go away in 1 week. °· You have new problems. °· You do not feel well. °· The pain spreads into your legs. °· You cannot control when you poop (bowel movement) or pee (urinate). °· Your arms or legs feel weak or lose feeling (numbness). °· You feel sick to your stomach (nauseous) or throw up (vomit). °· You have belly (abdominal) pain. °· You feel like you may pass out (faint). °MAKE SURE YOU:  °· Understand these instructions. °· Will watch your condition. °· Will get help right away if you are not doing well or get worse. °Document Released: 06/21/2007 Document Revised: 03/27/2011 Document Reviewed: 05/06/2013 °[employer]® Patient Information ©2015 [employer]. This information is not intended  to replace advice given to you by your health care provider. Make sure you discuss any questions you have with your health care provider. ° °

## 2014-01-04 NOTE — ED Notes (Signed)
MD at bedside. 

## 2014-01-04 NOTE — ED Notes (Addendum)
Pt c/o lower back pain and vomiting sent here from [facility] for [facility]. Cbc done HGB 10, pt was told  she is anemic and told to come here.

## 2014-03-03 ENCOUNTER — Other Ambulatory Visit: Payer: Self-pay | Admitting: Neurological Surgery

## 2014-03-16 ENCOUNTER — Encounter (HOSPITAL_COMMUNITY): Payer: Self-pay

## 2014-03-16 ENCOUNTER — Encounter (HOSPITAL_COMMUNITY)
Admission: RE | Admit: 2014-03-16 | Discharge: 2014-03-16 | Disposition: A | Discharge status: Home / Self Care | Payer: BLUE CROSS/BLUE SHIELD | Source: Ambulatory Visit | Attending: Neurological Surgery | Admitting: Neurological Surgery

## 2014-03-16 DIAGNOSIS — Z01812 Encounter for preprocedural laboratory examination: Secondary | ICD-10-CM | POA: Insufficient documentation

## 2014-03-16 DIAGNOSIS — M5416 Radiculopathy, lumbar region: Secondary | ICD-10-CM | POA: Diagnosis not present

## 2014-03-16 HISTORY — DX: Unspecified convulsions: R56.9

## 2014-03-16 HISTORY — DX: Anemia, unspecified: D64.9

## 2014-03-16 HISTORY — DX: Cardiac murmur, unspecified: R01.1

## 2014-03-16 HISTORY — DX: Headache, unspecified: R51.9

## 2014-03-16 HISTORY — DX: Headache: R51

## 2014-03-16 LAB — BASIC METABOLIC PANEL
Anion gap: 6 (ref 5–15)
BUN: 7 mg/dL (ref 6–23)
CO2: 27 mmol/L (ref 19–32)
Calcium: 8.5 mg/dL (ref 8.4–10.5)
Chloride: 104 mmol/L (ref 96–112)
Creatinine, Ser: 0.69 mg/dL (ref 0.50–1.10)
GFR calc Af Amer: >90 mL/min (ref >90)
GFR calc non Af Amer: >90 mL/min (ref >90)
Glucose, Bld: 76 mg/dL (ref 70–99)
Potassium: 3.7 mmol/L (ref 3.5–5.1)
Sodium: 137 mmol/L (ref 135–145)

## 2014-03-16 LAB — CBC
HCT: 32 % — ABNORMAL LOW (ref 36.0–46.0)
Hemoglobin: 9.5 g/dL — ABNORMAL LOW (ref 12.0–15.0)
MCH: 23.5 pg — ABNORMAL LOW (ref 26.0–34.0)
MCHC: 29.7 g/dL — ABNORMAL LOW (ref 30.0–36.0)
MCV: 79 fL (ref 78.0–100.0)
Platelets: 532 10*3/uL — ABNORMAL HIGH (ref 150–400)
RBC: 4.05 MIL/uL (ref 3.87–5.11)
RDW: 17.6 % — ABNORMAL HIGH (ref 11.5–15.5)
WBC: 6.4 10*3/uL (ref 4.0–10.5)

## 2014-03-16 LAB — ABO/RH: ABO/RH(D): O NEG

## 2014-03-16 LAB — HCG, SERUM, QUALITATIVE: Preg, Serum: NEGATIVE

## 2014-03-16 LAB — SURGICAL PCR SCREEN
MRSA, PCR: NEGATIVE
Staphylococcus aureus: POSITIVE — AB

## 2014-03-16 NOTE — Pre-Procedure Instructions (Addendum)
Nancy FetterChristine E Glennie  03/16/2014   Your procedure is scheduled on: Monday, March 23, 2014  [facility] at 11:00 AM.( Per MD)  Call this number if you have problems the morning of surgery: [phone]   Remember:   Do not eat food or drink liquids after midnight Sunday, March 22, 2014   Take these medicines the morning of surgery with A SIP OF WATER: norgestrel-ethinyl estradiol (LO/OVRAL,CRYSELLE),  if needed: oxyCODONE-acetaminophen (PERCOCET) for pain, ondansetron (ZOFRAN) for nausea or vomiting   Stop taking meloxicam ,Aspirin, vitamins, and herbal medications. Do not take any NSAIDs ie: Ibuprofen, Advil, Naproxen or any medication containing Aspirin; stop now.   Do not wear jewelry, make-up or nail polish.  Do not wear lotions, powders, or perfumes. You may not wear deodorant.  Do not shave 48 hours prior to surgery.   Do not bring valuables to the hospital.  [facility] not responsible  for any belongings or valuables.               Contacts, dentures or bridgework may not be worn into surgery.  Leave suitcase in the car. After surgery it may be brought to your room.  For patients admitted to the hospital, discharge time is determined by your treatment team.               Patients discharged the day of surgery will not be allowed to drive home.  Name and phone number of your driver:   Special Instructions:  Special Instructions:Special Instructions: [facility] - Preparing for [facility]  Before surgery, you can play an important role.  Because skin is not sterile, your skin needs to be as free of germs as possible.  You can reduce the number of germs on you skin by washing with CHG (chlorahexidine gluconate) soap before surgery.  CHG is an antiseptic cleaner which kills germs and bonds with the skin to continue killing germs even after washing.  Please DO NOT use if you have an allergy to CHG or antibacterial soaps.  If your skin becomes  reddened/irritated stop using the CHG and inform your nurse when you arrive at Short Stay.  Do not shave (including legs and underarms) for at least 48 hours prior to the first CHG shower.  You may shave your face.  Please follow these instructions carefully:   1.  Shower with CHG Soap the night before surgery and the morning of Surgery.  2.  If you choose to wash your hair, wash your hair first as usual with your normal shampoo.  3.  After you shampoo, rinse your hair and body thoroughly to remove the Shampoo.  4.  Use CHG as you would any other liquid soap.  You can apply chg directly  to the skin and wash gently with scrungie or a clean washcloth.  5.  Apply the CHG Soap to your body ONLY FROM THE NECK DOWN.  Do not use on open wounds or open sores.  Avoid contact with your eyes, ears, mouth and genitals (private parts).  Wash genitals (private parts) with your normal soap.  6.  Wash thoroughly, paying special attention to the area where your surgery will be performed.  7.  Thoroughly rinse your body with warm water from the neck down.  8.  DO NOT shower/wash with your normal soap after using and rinsing off the CHG Soap.  9.  Pat yourself dry with a clean towel.  10.  Wear clean pajamas.            11.  Place clean sheets on your bed the night of your first shower and do not sleep with pets.  Day of Surgery  Do not apply any lotions/deodorants the morning of surgery.  Please wear clean clothes to the hospital/surgery center.   Please read over the following fact sheets that you were given: Pain Booklet, Coughing and Deep Breathing, Blood Transfusion Information, MRSA Information and Surgical Site Infection Prevention

## 2014-03-22 MED ORDER — CEFAZOLIN SODIUM-DEXTROSE 2-3 GM-% IV SOLR
2.0000 g | INTRAVENOUS | Status: AC
  Administered 2014-03-23: 2 g via INTRAVENOUS
  Filled 2014-03-22: qty 50

## 2014-03-23 ENCOUNTER — Inpatient Hospital Stay (HOSPITAL_COMMUNITY): Payer: BLUE CROSS/BLUE SHIELD

## 2014-03-23 ENCOUNTER — Inpatient Hospital Stay (HOSPITAL_COMMUNITY): Payer: BLUE CROSS/BLUE SHIELD | Admitting: Certified Registered Nurse Anesthetist

## 2014-03-23 ENCOUNTER — Encounter (HOSPITAL_COMMUNITY)
Admission: RE | Disposition: A | Discharge status: Home / Self Care | Payer: BLUE CROSS/BLUE SHIELD | Source: Ambulatory Visit | Attending: Neurological Surgery

## 2014-03-23 ENCOUNTER — Encounter (HOSPITAL_COMMUNITY): Payer: Self-pay | Admitting: *Deleted

## 2014-03-23 ENCOUNTER — Inpatient Hospital Stay (HOSPITAL_COMMUNITY)
Admission: RE | Admit: 2014-03-23 | Discharge: 2014-03-27 | DRG: 460 | Disposition: A | Discharge status: Home / Self Care | Payer: BLUE CROSS/BLUE SHIELD | Source: Ambulatory Visit | Attending: Neurological Surgery | Admitting: Neurological Surgery

## 2014-03-23 DIAGNOSIS — Z888 Allergy status to other drugs, medicaments and biological substances status: Secondary | ICD-10-CM | POA: Diagnosis not present

## 2014-03-23 DIAGNOSIS — Z79891 Long term (current) use of opiate analgesic: Secondary | ICD-10-CM | POA: Diagnosis not present

## 2014-03-23 DIAGNOSIS — Z793 Long term (current) use of hormonal contraceptives: Secondary | ICD-10-CM | POA: Diagnosis not present

## 2014-03-23 DIAGNOSIS — Z79899 Other long term (current) drug therapy: Secondary | ICD-10-CM | POA: Diagnosis not present

## 2014-03-23 DIAGNOSIS — Z886 Allergy status to analgesic agent status: Secondary | ICD-10-CM

## 2014-03-23 DIAGNOSIS — M4727 Other spondylosis with radiculopathy, lumbosacral region: Principal | ICD-10-CM | POA: Diagnosis present

## 2014-03-23 DIAGNOSIS — Z791 Long term (current) use of non-steroidal anti-inflammatories (NSAID): Secondary | ICD-10-CM

## 2014-03-23 DIAGNOSIS — Z9884 Bariatric surgery status: Secondary | ICD-10-CM

## 2014-03-23 DIAGNOSIS — M5416 Radiculopathy, lumbar region: Secondary | ICD-10-CM

## 2014-03-23 DIAGNOSIS — M47817 Spondylosis without myelopathy or radiculopathy, lumbosacral region: Secondary | ICD-10-CM | POA: Diagnosis present

## 2014-03-23 DIAGNOSIS — Z419 Encounter for procedure for purposes other than remedying health state, unspecified: Secondary | ICD-10-CM

## 2014-03-23 DIAGNOSIS — M549 Dorsalgia, unspecified: Secondary | ICD-10-CM | POA: Diagnosis present

## 2014-03-23 SURGERY — POSTERIOR LUMBAR FUSION 1 LEVEL
Anesthesia: General | Site: Back

## 2014-03-23 MED ORDER — DEXTROSE 5 % IV SOLN
500.0000 mg | Freq: Four times a day (QID) | INTRAVENOUS | Status: DC | PRN
  Administered 2014-03-23: 500 mg via INTRAVENOUS
  Filled 2014-03-23 (×3): qty 5

## 2014-03-23 MED ORDER — FENTANYL CITRATE 0.05 MG/ML IJ SOLN
25.0000 ug | INTRAMUSCULAR | Status: DC | PRN
  Administered 2014-03-23 (×2): 25 ug via INTRAVENOUS
  Administered 2014-03-23 (×2): 50 ug via INTRAVENOUS

## 2014-03-23 MED ORDER — ROCURONIUM BROMIDE 100 MG/10ML IV SOLN
INTRAVENOUS | Status: DC | PRN
  Administered 2014-03-23: 50 mg via INTRAVENOUS

## 2014-03-23 MED ORDER — NEOSTIGMINE METHYLSULFATE 10 MG/10ML IV SOLN
INTRAVENOUS | Status: DC | PRN
  Administered 2014-03-23: 5 mg via INTRAVENOUS

## 2014-03-23 MED ORDER — LACTATED RINGERS IV SOLN
INTRAVENOUS | Status: DC | PRN
  Administered 2014-03-23 (×3): via INTRAVENOUS

## 2014-03-23 MED ORDER — PHENYLEPHRINE 40 MCG/ML (10ML) SYRINGE FOR IV PUSH (FOR BLOOD PRESSURE SUPPORT)
PREFILLED_SYRINGE | INTRAVENOUS | Status: AC
  Filled 2014-03-23: qty 10

## 2014-03-23 MED ORDER — PROMETHAZINE HCL 25 MG/ML IJ SOLN: 6.2500 mg | INTRAMUSCULAR | Status: DC | PRN

## 2014-03-23 MED ORDER — FENTANYL CITRATE 0.05 MG/ML IJ SOLN
25.0000 ug | Freq: Once | INTRAMUSCULAR | Status: AC
  Administered 2014-03-23: 50 ug via INTRAVENOUS

## 2014-03-23 MED ORDER — FENTANYL CITRATE 0.05 MG/ML IJ SOLN
INTRAMUSCULAR | Status: AC
  Filled 2014-03-23: qty 2

## 2014-03-23 MED ORDER — DEXAMETHASONE SODIUM PHOSPHATE 4 MG/ML IJ SOLN
INTRAMUSCULAR | Status: AC
  Filled 2014-03-23: qty 3

## 2014-03-23 MED ORDER — KETOROLAC TROMETHAMINE 15 MG/ML IJ SOLN
15.0000 mg | Freq: Four times a day (QID) | INTRAMUSCULAR | Status: AC
  Administered 2014-03-23 – 2014-03-24 (×5): 15 mg via INTRAVENOUS
  Filled 2014-03-23 (×4): qty 1

## 2014-03-23 MED ORDER — PROPOFOL 10 MG/ML IV BOLUS
INTRAVENOUS | Status: AC
  Filled 2014-03-23: qty 20

## 2014-03-23 MED ORDER — LIDOCAINE-EPINEPHRINE 1 %-1:100000 IJ SOLN
INTRAMUSCULAR | Status: DC | PRN
  Administered 2014-03-23: 10 mL

## 2014-03-23 MED ORDER — NEOSTIGMINE METHYLSULFATE 10 MG/10ML IV SOLN
INTRAVENOUS | Status: AC
  Filled 2014-03-23: qty 1

## 2014-03-23 MED ORDER — ONDANSETRON HCL 4 MG/2ML IJ SOLN
INTRAMUSCULAR | Status: AC
  Filled 2014-03-23: qty 2

## 2014-03-23 MED ORDER — SODIUM CHLORIDE 0.9 % IR SOLN
Status: DC | PRN
  Administered 2014-03-23: 14:00:00

## 2014-03-23 MED ORDER — EPHEDRINE SULFATE 50 MG/ML IJ SOLN
INTRAMUSCULAR | Status: AC
  Filled 2014-03-23: qty 1

## 2014-03-23 MED ORDER — OXYCODONE-ACETAMINOPHEN 5-325 MG PO TABS
1.0000 | ORAL_TABLET | ORAL | Status: DC | PRN
  Administered 2014-03-23 – 2014-03-24 (×5): 2 via ORAL
  Administered 2014-03-24: 1 via ORAL
  Administered 2014-03-24 – 2014-03-27 (×13): 2 via ORAL
  Filled 2014-03-23 (×19): qty 2

## 2014-03-23 MED ORDER — LIDOCAINE HCL (CARDIAC) 20 MG/ML IV SOLN
INTRAVENOUS | Status: DC | PRN
  Administered 2014-03-23: 100 mg via INTRAVENOUS
  Administered 2014-03-23: 50 mg via INTRAVENOUS

## 2014-03-23 MED ORDER — FENTANYL CITRATE 0.05 MG/ML IJ SOLN
INTRAMUSCULAR | Status: DC | PRN
  Administered 2014-03-23: 50 ug via INTRAVENOUS
  Administered 2014-03-23 (×3): 100 ug via INTRAVENOUS
  Administered 2014-03-23: 150 ug via INTRAVENOUS
  Administered 2014-03-23 (×2): 100 ug via INTRAVENOUS
  Administered 2014-03-23: 50 ug via INTRAVENOUS

## 2014-03-23 MED ORDER — FENTANYL CITRATE 0.05 MG/ML IJ SOLN
INTRAMUSCULAR | Status: AC
  Filled 2014-03-23: qty 5

## 2014-03-23 MED ORDER — ROCURONIUM BROMIDE 50 MG/5ML IV SOLN
INTRAVENOUS | Status: AC
  Filled 2014-03-23: qty 1

## 2014-03-23 MED ORDER — THROMBIN 5000 UNITS EX SOLR
OROMUCOSAL | Status: DC | PRN
  Administered 2014-03-23: 5 mL via TOPICAL

## 2014-03-23 MED ORDER — MIDAZOLAM HCL 2 MG/2ML IJ SOLN
INTRAMUSCULAR | Status: AC
  Filled 2014-03-23: qty 2

## 2014-03-23 MED ORDER — ARTIFICIAL TEARS OP OINT
TOPICAL_OINTMENT | OPHTHALMIC | Status: DC | PRN
  Administered 2014-03-23: 1 via OPHTHALMIC

## 2014-03-23 MED ORDER — STERILE WATER FOR INJECTION IJ SOLN
INTRAMUSCULAR | Status: AC
  Filled 2014-03-23: qty 30

## 2014-03-23 MED ORDER — LACTATED RINGERS IV SOLN
INTRAVENOUS | Status: DC
  Administered 2014-03-23: 12:00:00 via INTRAVENOUS

## 2014-03-23 MED ORDER — VECURONIUM BROMIDE 10 MG IV SOLR
INTRAVENOUS | Status: AC
  Filled 2014-03-23: qty 30

## 2014-03-23 MED ORDER — VECURONIUM BROMIDE 10 MG IV SOLR
INTRAVENOUS | Status: DC | PRN
  Administered 2014-03-23: 3 mg via INTRAVENOUS
  Administered 2014-03-23: 4 mg via INTRAVENOUS
  Administered 2014-03-23: 3 mg via INTRAVENOUS

## 2014-03-23 MED ORDER — MORPHINE SULFATE 2 MG/ML IJ SOLN
2.0000 mg | INTRAMUSCULAR | Status: DC | PRN
  Administered 2014-03-23 (×2): 2 mg via INTRAVENOUS
  Administered 2014-03-24 (×2): 4 mg via INTRAVENOUS
  Administered 2014-03-25: 2 mg via INTRAVENOUS
  Administered 2014-03-25: 4 mg via INTRAVENOUS
  Filled 2014-03-23: qty 2
  Filled 2014-03-23: qty 1
  Filled 2014-03-23 (×3): qty 2

## 2014-03-23 MED ORDER — LIDOCAINE HCL (CARDIAC) 20 MG/ML IV SOLN
INTRAVENOUS | Status: AC
Start: 2014-03-23 — End: 2014-03-23
  Filled 2014-03-23: qty 5

## 2014-03-23 MED ORDER — SODIUM CHLORIDE 0.9 % IJ SOLN
INTRAMUSCULAR | Status: AC
  Filled 2014-03-23: qty 30

## 2014-03-23 MED ORDER — ARTIFICIAL TEARS OP OINT
TOPICAL_OINTMENT | OPHTHALMIC | Status: AC
  Filled 2014-03-23: qty 3.5

## 2014-03-23 MED ORDER — ALBUMIN HUMAN 5 % IV SOLN
INTRAVENOUS | Status: DC | PRN
  Administered 2014-03-23 (×2): via INTRAVENOUS

## 2014-03-23 MED ORDER — OXYCODONE-ACETAMINOPHEN 5-325 MG PO TABS
ORAL_TABLET | ORAL | Status: AC
  Filled 2014-03-23: qty 2

## 2014-03-23 MED ORDER — PROPOFOL 10 MG/ML IV BOLUS
INTRAVENOUS | Status: DC | PRN
  Administered 2014-03-23: 200 mg via INTRAVENOUS

## 2014-03-23 MED ORDER — MIDAZOLAM HCL 5 MG/5ML IJ SOLN
INTRAMUSCULAR | Status: DC | PRN
  Administered 2014-03-23: 2 mg via INTRAVENOUS

## 2014-03-23 MED ORDER — SURGIFOAM 100 EX MISC
CUTANEOUS | Status: DC | PRN
  Administered 2014-03-23: 20 mL via TOPICAL

## 2014-03-23 MED ORDER — 0.9 % SODIUM CHLORIDE (POUR BTL) OPTIME
TOPICAL | Status: DC | PRN
  Administered 2014-03-23: 1000 mL

## 2014-03-23 MED ORDER — MUPIROCIN 2 % EX OINT
TOPICAL_OINTMENT | CUTANEOUS | Status: AC
  Administered 2014-03-23: 1
  Filled 2014-03-23: qty 22

## 2014-03-23 MED ORDER — SODIUM CHLORIDE 0.9 % IV SOLN: INTRAVENOUS | Status: DC

## 2014-03-23 MED ORDER — BUPIVACAINE HCL (PF) 0.5 % IJ SOLN
INTRAMUSCULAR | Status: DC | PRN
  Administered 2014-03-23: 20 mL
  Administered 2014-03-23: 10 mL

## 2014-03-23 MED ORDER — DEXAMETHASONE SODIUM PHOSPHATE 10 MG/ML IJ SOLN
INTRAMUSCULAR | Status: AC
  Filled 2014-03-23: qty 1

## 2014-03-23 MED ORDER — LIDOCAINE HCL (CARDIAC) 20 MG/ML IV SOLN
INTRAVENOUS | Status: AC
  Filled 2014-03-23: qty 10

## 2014-03-23 MED ORDER — GLYCOPYRROLATE 0.2 MG/ML IJ SOLN
INTRAMUSCULAR | Status: DC | PRN
  Administered 2014-03-23: 0.6 mg via INTRAVENOUS

## 2014-03-23 MED ORDER — ONDANSETRON HCL 4 MG/2ML IJ SOLN
INTRAMUSCULAR | Status: DC | PRN
  Administered 2014-03-23: 4 mg via INTRAVENOUS

## 2014-03-23 MED ORDER — DEXAMETHASONE SODIUM PHOSPHATE 4 MG/ML IJ SOLN
INTRAMUSCULAR | Status: DC | PRN
  Administered 2014-03-23: 10 mg via INTRAVENOUS

## 2014-03-23 MED ORDER — KETOROLAC TROMETHAMINE 15 MG/ML IJ SOLN
INTRAMUSCULAR | Status: AC
  Filled 2014-03-23: qty 1

## 2014-03-23 MED ORDER — GLYCOPYRROLATE 0.2 MG/ML IJ SOLN
INTRAMUSCULAR | Status: AC
  Filled 2014-03-23: qty 3

## 2014-03-23 MED ORDER — MIDAZOLAM HCL 2 MG/2ML IJ SOLN
INTRAMUSCULAR | Status: AC
  Administered 2014-03-23: 1 mg
  Filled 2014-03-23: qty 2

## 2014-03-23 MED ORDER — MIDAZOLAM HCL 2 MG/2ML IJ SOLN
2.0000 mg | Freq: Once | INTRAMUSCULAR | Status: AC
  Administered 2014-03-23: 1 mg via INTRAVENOUS

## 2014-03-23 MED ORDER — METHOCARBAMOL 500 MG PO TABS
500.0000 mg | ORAL_TABLET | Freq: Four times a day (QID) | ORAL | Status: DC | PRN
  Administered 2014-03-23 – 2014-03-27 (×14): 500 mg via ORAL
  Filled 2014-03-23 (×15): qty 1

## 2014-03-23 SURGICAL SUPPLY — 63 items
BAG DECANTER FOR FLEXI CONT (MISCELLANEOUS) ×2 IMPLANT
BLADE CLIPPER SURG (BLADE) IMPLANT
BONE CANC CHIPS 20CC PCAN1/4 (Bone Implant) ×2 IMPLANT
BONE MATRIX OSTEOCEL PRO MED (Bone Implant) ×1 IMPLANT
BUR MATCHSTICK NEURO 3.0 LAGG (BURR) ×2 IMPLANT
CAGE COROENT LRG 8X9X28M SPINE (Cage) ×2 IMPLANT
CANISTER SUCT 3000ML PPV (MISCELLANEOUS) ×2 IMPLANT
CHIPS CANC BONE 20CC PCAN1/4 (Bone Implant) ×1 IMPLANT
CONT SPEC 4OZ CLIKSEAL STRL BL (MISCELLANEOUS) ×4 IMPLANT
COVER BACK TABLE 60X90IN (DRAPES) ×2 IMPLANT
DECANTER SPIKE VIAL GLASS SM (MISCELLANEOUS) ×2 IMPLANT
DRAPE C-ARM 42X72 X-RAY (DRAPES) ×4 IMPLANT
DRAPE LAPAROTOMY 100X72X124 (DRAPES) ×2 IMPLANT
DRAPE POUCH INSTRU U-SHP 10X18 (DRAPES) ×2 IMPLANT
DRAPE PROXIMA HALF (DRAPES) ×1 IMPLANT
DRSG OPSITE POSTOP 4X8 (GAUZE/BANDAGES/DRESSINGS) ×1 IMPLANT
DURAPREP 26ML APPLICATOR (WOUND CARE) ×2 IMPLANT
ELECT REM PT RETURN 9FT ADLT (ELECTROSURGICAL) ×2
ELECTRODE REM PT RTRN 9FT ADLT (ELECTROSURGICAL) ×1 IMPLANT
GAUZE SPONGE 4X4 12PLY STRL (GAUZE/BANDAGES/DRESSINGS) ×2 IMPLANT
GAUZE SPONGE 4X4 16PLY XRAY LF (GAUZE/BANDAGES/DRESSINGS) IMPLANT
GLOVE BIOGEL PI IND STRL 8.5 (GLOVE) ×2 IMPLANT
GLOVE BIOGEL PI INDICATOR 8.5 (GLOVE) ×2
GLOVE ECLIPSE 7.5 STRL STRAW (GLOVE) ×4 IMPLANT
GLOVE ECLIPSE 8.5 STRL (GLOVE) ×4 IMPLANT
GLOVE EXAM NITRILE LRG STRL (GLOVE) IMPLANT
GLOVE EXAM NITRILE MD LF STRL (GLOVE) IMPLANT
GLOVE EXAM NITRILE XL STR (GLOVE) IMPLANT
GLOVE EXAM NITRILE XS STR PU (GLOVE) IMPLANT
GLOVE INDICATOR 7.5 STRL GRN (GLOVE) ×1 IMPLANT
GLOVE INDICATOR 8.0 STRL GRN (GLOVE) ×1 IMPLANT
GOWN STRL REUS W/ TWL LRG LVL3 (GOWN DISPOSABLE) IMPLANT
GOWN STRL REUS W/ TWL XL LVL3 (GOWN DISPOSABLE) IMPLANT
GOWN STRL REUS W/TWL 2XL LVL3 (GOWN DISPOSABLE) ×4 IMPLANT
GOWN STRL REUS W/TWL LRG LVL3 (GOWN DISPOSABLE)
GOWN STRL REUS W/TWL XL LVL3 (GOWN DISPOSABLE) ×2
GRAFT BNE CANC CHIPS 1-8 20CC (Bone Implant) IMPLANT
HEMOSTAT POWDER KIT SURGIFOAM (HEMOSTASIS) ×1 IMPLANT
KIT BASIN OR (CUSTOM PROCEDURE TRAY) ×2 IMPLANT
KIT ROOM TURNOVER OR (KITS) ×2 IMPLANT
LIQUID BAND (GAUZE/BANDAGES/DRESSINGS) ×2 IMPLANT
MILL MEDIUM DISP (BLADE) ×1 IMPLANT
NEEDLE HYPO 22GX1.5 SAFETY (NEEDLE) ×2 IMPLANT
NS IRRIG 1000ML POUR BTL (IV SOLUTION) ×2 IMPLANT
PACK LAMINECTOMY NEURO (CUSTOM PROCEDURE TRAY) ×2 IMPLANT
PAD ARMBOARD 7.5X6 YLW CONV (MISCELLANEOUS) ×8 IMPLANT
PATTIES SURGICAL .5 X1 (DISPOSABLE) ×2 IMPLANT
ROD RELINE-O LORD 5.5X35MM (Rod) ×2 IMPLANT
SCREW LOCK RELINE 5.5 TULIP (Screw) ×4 IMPLANT
SCREW RELINE-O POLY 6.5X45 (Screw) ×4 IMPLANT
SPONGE LAP 4X18 X RAY DECT (DISPOSABLE) IMPLANT
SPONGE SURGIFOAM ABS GEL 100 (HEMOSTASIS) ×2 IMPLANT
SUT VIC AB 1 CT1 18XBRD ANBCTR (SUTURE) ×1 IMPLANT
SUT VIC AB 1 CT1 8-18 (SUTURE) ×2
SUT VIC AB 2-0 CP2 18 (SUTURE) ×2 IMPLANT
SUT VIC AB 3-0 SH 8-18 (SUTURE) ×2 IMPLANT
SYR 20ML ECCENTRIC (SYRINGE) ×2 IMPLANT
SYR 3ML LL SCALE MARK (SYRINGE) ×8 IMPLANT
TOWEL OR 17X24 6PK STRL BLUE (TOWEL DISPOSABLE) ×2 IMPLANT
TOWEL OR 17X26 10 PK STRL BLUE (TOWEL DISPOSABLE) ×2 IMPLANT
TRAP SPECIMEN MUCOUS 40CC (MISCELLANEOUS) ×2 IMPLANT
TRAY FOLEY CATH 14FRSI W/METER (CATHETERS) ×2 IMPLANT
WATER STERILE IRR 1000ML POUR (IV SOLUTION) ×2 IMPLANT

## 2014-03-23 NOTE — Progress Notes (Signed)
Patient ID: Kelli FetterChristine E Barnett, female   DOB: Aug 14, 1975, 39 y.o.   MRN: [id number] Vital signs are stable Motor function is intact in lower extremities Minimal back soreness Doing well postop

## 2014-03-23 NOTE — Progress Notes (Signed)
Dr Noreene LarssonJoslin updated @1845  of patient's moderate-severe pain despite doses of IV Fentanyl, IV Toradol, and IV Robaxin. Orders given for IV Versed and additional Fentanyl. Dr Noreene LarssonJoslin returned within 15 minutes to re-evaluate, patient reports minimal pain reduction. Dr Noreene LarssonJoslin medicated pt with IV Precedex 40 mcg with desired sedation achieved.

## 2014-03-23 NOTE — Op Note (Signed)
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 Posterior lumbar interbody arthrodesis L5-S1 with peek spacers local autograft and allograft, posterior lateral arthrodesis L5-S1 with pedicle screw fixation L5-S1. Assistant: Delma OfficerJeff Jenkins M.D. Anesthesia: Gen. endotracheal Indications: Melvenia BeamChristine Barwick is a 39 year old individual's had a chronic radiculopathy on right side. She hadn't extraforaminal discectomy and has had persistent and worsening pain in both the back and the right lower extremity. An intradiscal injection gave him brief temporary relief of the pain. She's been advised regarding the need for surgical decompression and arthrodesis at L5-S1.  Procedure: Patient was brought to the operating room supine on a stretcher. After the smooth induction of general endotracheal anesthesia she was turned prone. The back was prepped with alcohol DuraPrep. A midline incision was created and carried down to the lumbar dorsal fascia which was opened on either side of the midline to expose the interlaminar space at L5-S1. On the right side was evidence from the previous extraforaminal surgery. The dissection was carried out over the facet joints to expose the transverse process of L5 and the alar of the sacrum. Both these areas were decorticated and packed away for later usage and grafting. Wide laminotomies were then created in L5-S1 removing the inferior margin of the lamina of L5 out to and including the superior facet joint. The dissection was taken down through the L ligament the common dural tube was exposed. On the right side was noted be significant  stenosis from bony osteophytic ridge both in the canal and into the foramen. This dissection was taken so as to open the disc space and an able evacuation of it. The L5 nerve root however was scarred superiorly and this required careful dissection with a 1 and 2 mm Kerrison punch. On the left side there was significant spondylitic ridging from the disc itself and this required decompression for the L5 nerve root superiorly and S1 nerve root inferiorly. Once the nerve roots were decompressed the could be mobilized and protected and then the total discectomy was performed at L5-S1 removing the inferior endplate of L5 and the superior endplate of S1. The inner body space was then sized for appropriate size spacer was felt that a 10 mm x 28 mm a degree lordotic spacer would fit best into this interspace. This was done on the right side and then on the left side. Auto and allograft was placed into the interspace. Lateral gutters were then filled with graft also. Pedicle entry sites were then chosen at L5 and S1 and 6.5 x 45 mm screws were placed into L5 and S1 on both sides. A 40 mm rod was used to connect the screws from L5-S1 in a neutral position. Final radiographs were obtained and identified good placement of the hardware and good alignment of the vertebrae. Final inspection of the common dural tube the L5 nerve root and the S1 nerve root was undertaken and when this was found to be completely decompressed without any bony particulate matter in eating the way we then closed the lumbar dorsal fascia with #1 Vicryl in interrupted fashion 2-0 Vicryl was used in the subcutaneous tissues and 3-0 Vicryl subcuticularly. Dermabond was placed on the skin. Blood loss was estimated at 250 mL.

## 2014-03-23 NOTE — Transfer of Care (Signed)
Immediate Anesthesia Transfer of Care Note  Patient: Kelli Barnett  Procedure(s) Performed: Procedure(s): Lumbar five-sacral one Posterior lumbar interbody fusion with pedicle screw fixation (N/A)  Patient Location: PACU  Anesthesia Type:General  Level of Consciousness: awake, oriented, sedated, patient cooperative and responds to stimulation  Airway & Oxygen Therapy: Patient Spontanous Breathing and Patient connected to nasal cannula oxygen  Post-op Assessment: Report given to RN, Post -op Vital signs reviewed and stable, Patient moving all extremities and Patient moving all extremities X 4  Post vital signs: Reviewed and stable  Last Vitals:  Filed Vitals:   03/23/14 1056  BP: 145/82  Pulse: 99  Temp: 36.5 C  Resp: 18    Complications: No apparent anesthesia complications

## 2014-03-23 NOTE — H&P (Signed)
Kelli Barnett is an 39 y.o. female.   Chief Complaint: Back and right lower extremity pain. HPI: Patient is a 39 year old individual who is had chronic problems with back and right lower extremity pain for nearly 2 years time. She's had intermittent problems with disc degenerative changes at L5-S1, she is previously responded to epidural steroid injections, however recently she's had refractoriness pain. An extra foraminal disc protrusion was decompressed via an extraforaminal discectomy some time ago however she has had no relief of pain. Recent os was a disability index she rates at 60. Despite efforts at conservative management the only relief she had was able brief respite when she had an intradiscal injection. Discography was positive. She is admitted now to undergo decompression and fusion of L5-S1.  Past Medical History  Diagnosis Date  . Lumbar vertebral fracture   . Heart murmur     child echo done as child  . Depression     no meds few yrs  . Seizures     hx epilepsy last seizure 39 yrs old  . Headache   . Anemia     hx    Past Surgical History  Procedure Laterality Date  . Gastric bypass  2009  . Ulnar nerve transposition Left 03  . Cholecystectomy    . Back surgery  15  . Ganglion cyst excision Right     wrist    No family history on file. Social History:  reports that she has never smoked. She does not have any smokeless tobacco history on file. She reports that she drinks alcohol. She reports that she does not use illicit drugs.  Allergies:  Allergies  Allergen Reactions  . Tussionex Pennkinetic Er [Hydrocod Polst-Cpm Polst Er]   . Naproxen Nausea And Vomiting    Medications Prior to Admission  Medication Sig Dispense Refill  . meloxicam (MOBIC) 7.5 MG tablet Take 7.5 mg by mouth 2 (two) times daily.    . methocarbamol (ROBAXIN) 500 MG tablet Take 500 mg by mouth every 6 (six) hours as needed for muscle spasms.    . norgestrel-ethinyl estradiol  (LO/OVRAL,CRYSELLE) 0.3-30 MG-MCG tablet Take 1 tablet by mouth daily.    . ondansetron (ZOFRAN) 4 MG tablet Take 4 mg by mouth every 8 (eight) hours as needed for nausea or vomiting.    Marland Kitchen oxyCODONE-acetaminophen (PERCOCET) 10-325 MG per tablet Take 1 tablet by mouth every 6 (six) hours as needed for pain.    Marland Kitchen ibuprofen (ADVIL,MOTRIN) 200 MG tablet Take 600 mg by mouth as needed.    . predniSONE (DELTASONE) 10 MG tablet Take 2 tablets (20 mg total) by mouth daily. 10 tablet 0    No results found for this or any previous visit (from the past 48 hour(s)). No results found.  ROS  There were no vitals taken for this visit. Physical Exam   Assessment/Plan Since her last visit, she has had a little relief and has continued to have deterioration with increasing back pain and decreased level of function.  Today, she scores an Oswestry Disability Index at 60.  Over the holidays, she had been seen in the emergency room on one occasion because the pain was intractable in the back and the right leg.  She was given some Dilaudid which seemed to give her some transient relief.  Nonetheless, she finds that she is requiring ever-increasing doses of non-narcotic pain medication, and she has had increasing nausea with the pain and with the medication both.  DATA:                                                  I reviewed her MRI and her plain x-rays that we have.  She has been experiencing advancing degeneration of the L5-S1 space.  This problem started in 2013, and she has had extensive conservative management for it, including self-directed exercise programs, a number of injections, physical therapy, and recumbency all without relief.  She continues to demonstrate that she has a severe irritability in the right lumbar region, and today's exam is no exception.    PHYSICAL EXAMINATION:                    On examination, I note that she has weakness in the tibialis anterior on the right.  She notes that there  are numbness and dysesthesias onto the lateral aspect of the right leg and foot.  Straight leg raising is markedly positive at 15 degrees.    IMPRESSION/PLAN:                             Back in 11/2013, we did a singular, intradiscal injection which gave very little but transient relief of the significant pain.  Given the fact that her disease process is limited to the L5-S1 space and that there are broad-based degenerative changes on the left and the right and in the past she has had alternating radicular symptoms involving the left lower extremity and now mostly the right, I believe that this joint will need to be decompressed and stabilized.  This will require a posterior approach to do a total discectomy, decompress both the L5 and the S1 nerve roots, and perform an interbody arthrodesis using PEEK spacers and pedicle screw fixation at L5 and S1 to hold the joint together.  This is a substantial operation.  However, once it is healed, hopefully she should have good relief of her right-sided radiculopathy and good stability in her spine.  The L4-5 level shows no involvement and is without any evidence of nerve root involvement at all, and I am hopeful that this should serve her well for the long term.  We will plan on scheduling this surgery at her earliest convenience.   Kaheem Halleck J 03/23/2014, 10:48 AM

## 2014-03-23 NOTE — Anesthesia Preprocedure Evaluation (Signed)
Anesthesia Evaluation  Patient identified by MRN, date of birth, ID band Patient awake    Airway Mallampati: II  TM Distance: >3 FB Neck ROM: Full    Dental   Pulmonary neg pulmonary ROS,  breath sounds clear to auscultation        Cardiovascular negative cardio ROS  Rhythm:Regular Rate:Normal     Neuro/Psych    GI/Hepatic negative GI ROS, Neg liver ROS,   Endo/Other  negative endocrine ROS  Renal/GU negative Renal ROS     Musculoskeletal   Abdominal   Peds  Hematology negative hematology ROS (+)   Anesthesia Other Findings   Reproductive/Obstetrics                             Anesthesia Physical Anesthesia Plan  ASA: II  Anesthesia Plan: General   Post-op Pain Management:    Induction: Intravenous  Airway Management Planned: Oral ETT  Additional Equipment:   Intra-op Plan:   Post-operative Plan: Extubation in OR  Informed Consent: I have reviewed the patients History and Physical, chart, labs and discussed the procedure including the risks, benefits and alternatives for the proposed anesthesia with the patient or authorized representative who has indicated his/her understanding and acceptance.   Dental advisory given  Plan Discussed with: Anesthesiologist and CRNA  Anesthesia Plan Comments:         Anesthesia Quick Evaluation

## 2014-03-23 NOTE — Anesthesia Postprocedure Evaluation (Signed)
  Anesthesia Post-op Note  Patient: Kelli Barnett  Procedure(s) Performed: Procedure(s) (LRB): Lumbar five-sacral one Posterior lumbar interbody fusion with pedicle screw fixation (N/A)  Patient Location: PACU  Anesthesia Type: General  Level of Consciousness: awake and alert   Airway and Oxygen Therapy: Patient Spontanous Breathing  Post-op Pain: mild  Post-op Assessment: Post-op Vital signs reviewed, Patient's Cardiovascular Status Stable, Respiratory Function Stable, Patent Airway and No signs of Nausea or vomiting  Last Vitals:  Filed Vitals:   03/23/14 1745  BP: 134/79  Pulse: 102  Temp: 37 C  Resp: 14    Post-op Vital Signs: stable   Complications: No apparent anesthesia complications

## 2014-03-24 MED ORDER — POLYETHYLENE GLYCOL 3350 17 G PO PACK
17.0000 g | PACK | Freq: Every day | ORAL | Status: DC | PRN
  Filled 2014-03-24: qty 1

## 2014-03-24 MED ORDER — ZOLPIDEM TARTRATE 5 MG PO TABS
5.0000 mg | ORAL_TABLET | Freq: Every evening | ORAL | Status: DC | PRN
  Administered 2014-03-24 – 2014-03-26 (×3): 5 mg via ORAL
  Filled 2014-03-24 (×3): qty 1

## 2014-03-24 MED ORDER — MENTHOL 3 MG MT LOZG: 1.0000 | LOZENGE | OROMUCOSAL | Status: DC | PRN

## 2014-03-24 MED ORDER — SODIUM CHLORIDE 0.9 % IJ SOLN
3.0000 mL | Freq: Two times a day (BID) | INTRAMUSCULAR | Status: DC
  Administered 2014-03-24 – 2014-03-25 (×3): 3 mL via INTRAVENOUS
  Administered 2014-03-25: 10 mL via INTRAVENOUS
  Administered 2014-03-26: 3 mL via INTRAVENOUS

## 2014-03-24 MED ORDER — DOCUSATE SODIUM 100 MG PO CAPS
100.0000 mg | ORAL_CAPSULE | Freq: Two times a day (BID) | ORAL | Status: DC
  Administered 2014-03-24 – 2014-03-27 (×7): 100 mg via ORAL
  Filled 2014-03-24 (×7): qty 1

## 2014-03-24 MED ORDER — CEFAZOLIN SODIUM 1-5 GM-% IV SOLN
1.0000 g | Freq: Three times a day (TID) | INTRAVENOUS | Status: AC
  Administered 2014-03-24 (×2): 1 g via INTRAVENOUS
  Filled 2014-03-24 (×2): qty 50

## 2014-03-24 MED ORDER — SODIUM CHLORIDE 0.9 % IV SOLN: 250.0000 mL | INTRAVENOUS | Status: DC

## 2014-03-24 MED ORDER — ACETAMINOPHEN 325 MG PO TABS: 650.0000 mg | ORAL_TABLET | ORAL | Status: DC | PRN

## 2014-03-24 MED ORDER — ALUM & MAG HYDROXIDE-SIMETH 200-200-20 MG/5ML PO SUSP: 30.0000 mL | Freq: Four times a day (QID) | ORAL | Status: DC | PRN

## 2014-03-24 MED ORDER — ONDANSETRON HCL 4 MG/2ML IJ SOLN
4.0000 mg | INTRAMUSCULAR | Status: DC | PRN
  Administered 2014-03-24 – 2014-03-27 (×4): 4 mg via INTRAVENOUS
  Filled 2014-03-24 (×3): qty 2

## 2014-03-24 MED ORDER — SODIUM CHLORIDE 0.9 % IJ SOLN: 3.0000 mL | INTRAMUSCULAR | Status: DC | PRN

## 2014-03-24 MED ORDER — SENNA 8.6 MG PO TABS
1.0000 | ORAL_TABLET | Freq: Two times a day (BID) | ORAL | Status: DC
  Administered 2014-03-24 – 2014-03-27 (×7): 8.6 mg via ORAL
  Filled 2014-03-24 (×7): qty 1

## 2014-03-24 MED ORDER — BISACODYL 10 MG RE SUPP: 10.0000 mg | Freq: Every day | RECTAL | Status: DC | PRN

## 2014-03-24 MED ORDER — ONDANSETRON HCL 4 MG PO TABS: 4.0000 mg | ORAL_TABLET | Freq: Three times a day (TID) | ORAL | Status: DC | PRN

## 2014-03-24 MED ORDER — ACETAMINOPHEN 650 MG RE SUPP: 650.0000 mg | RECTAL | Status: DC | PRN

## 2014-03-24 MED ORDER — FLEET ENEMA 7-19 GM/118ML RE ENEM: 1.0000 | ENEMA | Freq: Once | RECTAL | Status: AC | PRN

## 2014-03-24 MED ORDER — PHENOL 1.4 % MT LIQD: 1.0000 | OROMUCOSAL | Status: DC | PRN

## 2014-03-24 MED ORDER — MORPHINE SULFATE 2 MG/ML IJ SOLN
1.0000 mg | INTRAMUSCULAR | Status: DC | PRN
  Administered 2014-03-24 – 2014-03-25 (×8): 4 mg via INTRAVENOUS
  Administered 2014-03-26 (×2): 2 mg via INTRAVENOUS
  Administered 2014-03-26: 4 mg via INTRAVENOUS
  Administered 2014-03-26 (×2): 2 mg via INTRAVENOUS
  Administered 2014-03-27 (×2): 4 mg via INTRAVENOUS
  Administered 2014-03-27 (×2): 2 mg via INTRAVENOUS
  Administered 2014-03-27: 4 mg via INTRAVENOUS
  Filled 2014-03-24: qty 2
  Filled 2014-03-24: qty 1
  Filled 2014-03-24: qty 2
  Filled 2014-03-24: qty 1
  Filled 2014-03-24 (×2): qty 2
  Filled 2014-03-24: qty 1
  Filled 2014-03-24 (×2): qty 2
  Filled 2014-03-24: qty 1
  Filled 2014-03-24 (×7): qty 2
  Filled 2014-03-24 (×2): qty 1

## 2014-03-24 MED ORDER — HYDROCODONE-ACETAMINOPHEN 5-325 MG PO TABS: 1.0000 | ORAL_TABLET | ORAL | Status: DC | PRN

## 2014-03-24 MED ORDER — NORGESTREL-ETHINYL ESTRADIOL 0.3-30 MG-MCG PO TABS
1.0000 | ORAL_TABLET | Freq: Every day | ORAL | Status: DC
Start: 2014-03-24 — End: 2014-03-27

## 2014-03-24 MED FILL — Sodium Chloride IV Soln 0.9%: INTRAVENOUS | Qty: 1000 | Status: AC

## 2014-03-24 MED FILL — Heparin Sodium (Porcine) Inj 1000 Unit/ML: INTRAMUSCULAR | Qty: 30 | Status: AC

## 2014-03-24 NOTE — Evaluation (Signed)
Physical Therapy Evaluation Patient Details Name: Kelli Barnett MRN: [id number] DOB: [birth year] Today's Date: 03/24/2014   History of Present Illness  s/p L5-S1 decompression/fusion  PMHx- lumbar fracture, discectomy 09/2013, gastric bypass  Clinical Impression  Patient is s/p above surgery resulting in the deficits listed below (see PT Problem List).  Patient will benefit from skilled PT to increase their independence and safety with mobility (while adhering to their precautions) to allow discharge to the venue listed below.     Follow Up Recommendations No PT follow up;Supervision for mobility/OOB    Equipment Recommendations  Rolling walker with 5" wheels (although pt hopeful she will not need RW)    Recommendations for Other Services OT consult     Precautions / Restrictions Precautions Precautions: Back Precaution Booklet Issued: Yes (comment) Precaution Comments: educated on back precautions Required Braces or Orthoses: Spinal Brace Spinal Brace: Lumbar corset;Applied in sitting position      Mobility  Bed Mobility Overal bed mobility: Needs Assistance Bed Mobility: Rolling;Sidelying to Sit Rolling: Supervision Sidelying to sit: Min guard       General bed mobility comments: with rail; vc for technique to maintain back precautions  Transfers Overall transfer level: Needs assistance Equipment used: Rolling walker (2 wheeled) Transfers: Sit to/from Stand Sit to Stand: Min assist         General transfer comment: steady assist as moving hands from surface to RW; vc for safe, proper use of RW  Ambulation/Gait Ambulation/Gait assistance: Min guard Ambulation Distance (Feet): 120 Feet Assistive device: Rolling walker (2 wheeled) Gait Pattern/deviations: Step-through pattern     General Gait Details: good velocity; nearly normal stride length; vc for proper use of rW  Stairs            Wheelchair Mobility    Modified Rankin (Stroke Patients  Only)       Balance Overall balance assessment: No apparent balance deficits (not formally assessed)                                           Pertinent Vitals/Pain Pain Assessment: 0-10 Pain Score: 5  Pain Location: back Pain Intervention(s): Limited activity within patient's tolerance;Monitored during session;Premedicated before session;Repositioned;Patient requesting pain meds-RN notified    Home Living Family/patient expects to be discharged to:: Private residence Living Arrangements: Spouse/significant other Available Help at Discharge: Family;Available 24 hours/day Type of Home: House Home Access: Stairs to enter Entrance Stairs-Rails: None Entrance Stairs-Number of Steps: 2 Home Layout: Two level;Bed/bath upstairs Home Equipment: Cane - single point      Prior Function Level of Independence: Independent with assistive device(s)         Comments: using cane x1 year after leg fractures     Hand Dominance        Extremity/Trunk Assessment   Upper Extremity Assessment: Defer to OT evaluation;Overall WFL for tasks assessed           Lower Extremity Assessment: Overall WFL for tasks assessed (Rt dorsiflexion 5/5; denies numbness)      Cervical / Trunk Assessment: Normal  Communication   Communication: No difficulties  Cognition Arousal/Alertness: Awake/alert Behavior During Therapy: WFL for tasks assessed/performed Overall Cognitive Status: Within Functional Limits for tasks assessed                      General Comments      Exercises  Assessment/Plan    PT Assessment Patient needs continued PT services  PT Diagnosis Difficulty walking;Acute pain   PT Problem List Decreased activity tolerance;Decreased mobility;Decreased knowledge of use of DME;Decreased knowledge of precautions;Pain  PT Treatment Interventions DME instruction;Gait training;Stair training;Functional mobility training;Therapeutic  activities;Patient/family education   PT Goals (Current goals can be found in the Care Plan section) Acute Rehab PT Goals Patient Stated Goal: walk without a device PT Goal Formulation: With patient Time For Goal Achievement: 03/27/14 Potential to Achieve Goals: Good    Frequency Min 5X/week   Barriers to discharge        Co-evaluation               End of Session Equipment Utilized During Treatment: Gait belt;Back brace Activity Tolerance: Patient tolerated treatment well Patient left: in chair;with call bell/phone within reach;with nursing/sitter in room (no chair alarm pads available) Nurse Communication: Mobility status         Time: [id number] PT Time Calculation (min) (ACUTE ONLY): 23 min   Charges:   PT Evaluation $Initial PT Evaluation Tier I: 1 Procedure PT Treatments $Gait Training: 8-22 mins   PT G Codes:        Edna Grover 03/24/2014, 9:27 AM Pager [phone]

## 2014-03-24 NOTE — Progress Notes (Signed)
Patient ID: Kelli FetterChristine E Barnett, female   DOB: Apr 04, 1975, 39 y.o.   MRN: [id number] Vital signs are stable Motor function appears intact Patient is starting to ambulate Moderate degree of back pain Continue to mobilize today No sleeping at night We'll add Ambien

## 2014-03-24 NOTE — Clinical Social Work Note (Signed)
CSW Consult Acknowledged:   CSW received a consult for SNF placement. Per PT/OT evaluation the appropriate level of care is stated a no PT is need. CSW will sign off.   Kelli Barnett, MSW, LCSWA [phone]

## 2014-03-24 NOTE — Progress Notes (Signed)
OT Cancellation Note  Patient Details Name: Kelli FetterChristine E Linders MRN: [id number] DOB: 1975/09/22   Cancelled Treatment:    Reason Eval/Treat Not Completed: Pt with other discipline  Angelene GiovanniConarpe, Laketha Leopard M  Gorje Iyer, OTR/L [phone]  03/24/2014, 3:31 PM

## 2014-03-25 LAB — TYPE AND SCREEN
ABO/RH(D): O NEG
Antibody Screen: NEGATIVE
Unit division: 0
Unit division: 0

## 2014-03-25 MED ORDER — DEXAMETHASONE 2 MG PO TABS
2.0000 mg | ORAL_TABLET | Freq: Two times a day (BID) | ORAL | Status: DC
  Administered 2014-03-25 – 2014-03-27 (×5): 2 mg via ORAL
  Filled 2014-03-25 (×5): qty 1

## 2014-03-25 MED ORDER — INFLUENZA VAC SPLIT QUAD 0.5 ML IM SUSY
0.5000 mL | PREFILLED_SYRINGE | INTRAMUSCULAR | Status: DC
Start: 2014-03-26 — End: 2014-03-25

## 2014-03-25 MED ORDER — KETOROLAC TROMETHAMINE 15 MG/ML IJ SOLN
15.0000 mg | Freq: Four times a day (QID) | INTRAMUSCULAR | Status: AC
  Administered 2014-03-25 – 2014-03-26 (×5): 15 mg via INTRAVENOUS
  Filled 2014-03-25 (×5): qty 1

## 2014-03-25 NOTE — Progress Notes (Signed)
PT Cancellation Note  Patient Details Name: Nancy FetterChristine E Daughdrill MRN: [id number] DOB: June 27, 1975   Cancelled Treatment:    Reason Eval/Treat Not Completed: Pain limiting ability to participate;Fatigue/lethargy limiting ability to participate. Pt just completed OT session; has had pain medicine and needs more time for it to take effect.   [name] 03/25/2014, 10:58 AM  Pager [phone]

## 2014-03-25 NOTE — Progress Notes (Signed)
Nurse removed honeycomb drsg from back as ordered. Pt tolerated well. Will monitor   Andrew AuVafiadis, Camala Talwar I 03/25/2014 11:19 AM

## 2014-03-25 NOTE — Progress Notes (Signed)
Physical Therapy Treatment Patient Details Name: Kelli Barnett MRN: [id number] DOB: 1975/04/07 Today's Date: 03/25/2014    History of Present Illness s/p L5-S1 decompression/fusion  PMHx- lumbar fracture, discectomy 09/2013, gastric bypass    PT Comments    Patient progressing well with mobility. Tolerated stair negotiation with S for safety. Ambulating community distances with and without use of RW. Encouraged pt to use RW for hospital ambulation for pain control and safety. Pt does not require further skilled therapy services as pt functioning at Mod I-S level and has met all goals. Discharge from therapy.   Follow Up Recommendations  No PT follow up;Supervision for mobility/OOB     Equipment Recommendations  Rolling walker with 5" wheels    Recommendations for Other Services       Precautions / Restrictions Precautions Precautions: Back Precaution Booklet Issued: Yes (comment) Precaution Comments: Able to verbalize 3/3 back precautions independently. Required Braces or Orthoses: Spinal Brace Spinal Brace: Lumbar corset;Applied in sitting position Restrictions Weight Bearing Restrictions: No    Mobility  Bed Mobility Overal bed mobility: Modified Independent Bed Mobility: Rolling;Sidelying to Sit;Sit to Sidelying Rolling: Modified independent (Device/Increase time) Sidelying to sit: Modified independent (Device/Increase time)     Sit to sidelying: Modified independent (Device/Increase time) General bed mobility comments: Good demo of log roll technique. HOB flat, no use of rails to simulate home environment.  Transfers Overall transfer level: Needs assistance Equipment used: Rolling walker (2 wheeled) Transfers: Sit to/from Stand Sit to Stand: Modified independent (Device/Increase time) Stand pivot transfers: Supervision       General transfer comment: demonstrates good safety awareness. Stood from [employer].   Ambulation/Gait Ambulation/Gait assistance:  Modified independent (Device/Increase time) Ambulation Distance (Feet): 510 Feet Assistive device: Rolling walker (2 wheeled) Gait Pattern/deviations: Step-through pattern   Gait velocity interpretation: at or above normal speed for age/gender General Gait Details: Steady gait, good velocity. Ambulated with and without use of RW. Pain increased without use of RW.   Stairs Stairs: Yes Stairs assistance: Supervision Stair Management: One rail Right;Alternating pattern;Step to pattern Number of Stairs: 11 General stair comments: Cues for technique.  Wheelchair Mobility    Modified Rankin (Stroke Patients Only)       Balance Overall balance assessment: Needs assistance Sitting-balance support: Feet supported;No upper extremity supported Sitting balance-Leahy Scale: Good Sitting balance - Comments: Able to donn/doff brace independently.   Standing balance support: During functional activity Standing balance-Leahy Scale: Fair                      Cognition Arousal/Alertness: Awake/alert Behavior During Therapy: WFL for tasks assessed/performed Overall Cognitive Status: Within Functional Limits for tasks assessed                      Exercises      General Comments        Pertinent Vitals/Pain Pain Assessment: 0-10 Pain Score: 6  Pain Location: back at surgical site Pain Descriptors / Indicators: Aching Pain Intervention(s): Limited activity within patient's tolerance;Monitored during session    [place] expects to be discharged to:: Private residence Living Arrangements: Spouse/significant other Available Help at Discharge: Family;Available 24 hours/day Type of Home: House Home Access: Stairs to enter Entrance Stairs-Rails: None Home Layout: Two level;Bed/bath upstairs Home Equipment: Cane - single point      Prior Function Level of Independence: Independent with assistive device(s)      Comments: using cane x1 year after  leg fractures.  Pt writes Librarian, academic for [employer]    PT Goals (current goals can now be found in the care plan section) Acute Rehab PT Goals Patient Stated Goal: to regain independence  Progress towards PT goals: Goals met/education completed, patient discharged from PT    Frequency  Min 5X/week    PT Plan Current plan remains appropriate    Co-evaluation             End of Session Equipment Utilized During Treatment: Back brace;Gait belt Activity Tolerance: Patient tolerated treatment well Patient left: in bed;with call bell/phone within reach     Time: 1351-1404 PT Time Calculation (min) (ACUTE ONLY): 13 min  Charges:  $Gait Training: 8-22 mins                    G CodesCandy Sledge A 04-04-14, 2:39 PM Candy Sledge, [place], DPT [phone]

## 2014-03-25 NOTE — Progress Notes (Signed)
Pt ambulated in room and to bathroom approx 30 feet with brace and walker. Pt tolerated well. Will monitor Andrew AuVafiadis, Skylyn Slezak I 03/25/2014  1:26 PM

## 2014-03-25 NOTE — Evaluation (Signed)
Occupational Therapy Evaluation Patient Details Name: Kelli Barnett MRN: [id number] DOB: Nov 21, 1975 Today's Date: 03/25/2014    History of Present Illness s/p L5-S1 decompression/fusion  PMHx- lumbar fracture, discectomy 09/2013, gastric bypass   Clinical Impression   Pt admitted with above. She demonstrates the below listed deficits and will benefit from continued OT to maximize safety and independence with BADLs.  Pt with increased pain during eval 7-8/10.  She is very motivated.  Currently, she requires supervision to mod A with BADLs.  Will follow.       Follow Up Recommendations  No OT follow up;Supervision - Intermittent    Equipment Recommendations  3 in 1 bedside comode    Recommendations for Other Services       Precautions / Restrictions Precautions Precautions: Back Precaution Booklet Issued: Yes (comment) Precaution Comments: Pt able to state precautions independently and demonstrates good awareness of precautions  Required Braces or Orthoses: Spinal Brace Spinal Brace: Lumbar corset;Applied in sitting position      Mobility Bed Mobility Overal bed mobility: Needs Assistance Bed Mobility: Rolling;Sidelying to Sit;Sit to Sidelying Rolling: Supervision Sidelying to sit: Supervision     Sit to sidelying: Supervision General bed mobility comments: Pt demonstrates good awareness of back precautions    Transfers Overall transfer level: Needs assistance Equipment used: Rolling walker (2 wheeled) Transfers: Sit to/from [employer] Transfers Sit to Stand: Supervision Stand pivot transfers: Supervision       General transfer comment: demonstrates good safety awareness     Balance Overall balance assessment: Needs assistance Sitting-balance support: Feet supported Sitting balance-Leahy Scale: Good     Standing balance support: During functional activity Standing balance-Leahy Scale: Fair                              ADL Overall  ADL's : Needs assistance/impaired Eating/Feeding: Independent   Grooming: Wash/dry hands;Wash/dry face;Oral care;Brushing hair;Supervision/safety;Standing Grooming Details (indicate cue type and reason): Pt instructed in safety with oral care and to avoid bending  Upper Body Bathing: Supervision/ safety;Sitting   Lower Body Bathing: Moderate assistance;Sit to/from stand Lower Body Bathing Details (indicate cue type and reason): Pt instructed in use of LH back sponge or brush  Upper Body Dressing : Supervision/safety   Lower Body Dressing: Moderate assistance;Sit to/from stand Lower Body Dressing Details (indicate cue type and reason): Pt unablet o cross ankles over knees at this time, but reports she was able to do so PTA  Toilet Transfer: Supervision/safety;Ambulation;Comfort height toilet;BSC;RW   Toileting- Clothing Manipulation and Hygiene: Supervision/safety;Sit to/from stand   Tub/ Shower Transfer: Min guard;Ambulation;Rolling walker Tub/Shower Transfer Details (indicate cue type and reason): Pt instructed in safe technique for tub transfer  Functional mobility during ADLs: Supervision/safety;Rolling walker General ADL Comments: Pt is very motivated.  She reports increased pain this am,  Offered to return at another time, but pt asked to get up in hopes moving would improve pain.  Pain, however increased while up.       Vision     Perception     Praxis      Pertinent Vitals/Pain Pain Assessment: 0-10 Pain Score: 8  Pain Location: back  Pain Descriptors / Indicators: Aching;Constant Pain Intervention(s): Monitored during session;Repositioned;Premedicated before session     Hand Dominance     Extremity/Trunk Assessment Upper Extremity Assessment Upper Extremity Assessment: Overall WFL for tasks assessed   Lower Extremity Assessment Lower Extremity Assessment: Defer to PT evaluation   Cervical /  Trunk Assessment Cervical / Trunk Assessment: Normal   Communication  Communication Communication: No difficulties   Cognition Arousal/Alertness: Awake/alert Behavior During Therapy: WFL for tasks assessed/performed Overall Cognitive Status: Within Functional Limits for tasks assessed                     General Comments       Exercises       Shoulder Instructions      Home Living Family/patient expects to be discharged to:: Private residence Living Arrangements: Spouse/significant other Available Help at Discharge: Family;Available 24 hours/day Type of Home: House Home Access: Stairs to enter Enter[employer] CorporationEntrance Stairs-Number of Steps: 2 Entrance Stairs-Rails: None Home Layout: Two level;Bed/bath upstairs Alternate Level Stairs-Number of Steps: 14 Alternate Level Stairs-Rails: Right;Left Bathroom Shower/Tub: Tub/shower unit;Curtain Shower/tub characteristics: Engineer, building servicesCurtain Bathroom Toilet: Standard     Home Equipment: Cane - single point          Prior Functioning/Environment Level of Independence: Independent with assistive device(s)        Comments: using cane x1 year after leg fractures.  Pt writes Midwifetechnical manuals for [employer]     OT Diagnosis: Generalized weakness;Acute pain   OT Problem List: Decreased strength;Impaired balance (sitting and/or standing);Decreased activity tolerance;Decreased safety awareness;Decreased knowledge of use of DME or AE;Decreased knowledge of precautions;Pain   OT Treatment/Interventions: Self-care/ADL training;DME and/or AE instruction;Therapeutic activities;Patient/family education;Balance training    OT Goals(Current goals can be found in the care plan section) Acute Rehab OT Goals Patient Stated Goal: to regain independence  OT Goal Formulation: With patient Time For Goal Achievement: 04/01/14 Potential to Achieve Goals: Good ADL Goals Pt Will Perform Lower Body Bathing: with supervision;with adaptive equipment;sit to/from stand Pt Will Perform Lower Body Dressing: with supervision;with  adaptive equipment;sit to/from stand  OT Frequency: Min 2X/week   Barriers to D/C:            Co-evaluation              End of Session Equipment Utilized During Treatment: Back brace;Rolling walker Nurse Communication: Mobility status  Activity Tolerance: Patient limited by pain Patient left: in bed;with call bell/phone within reach   Time: 1027-1050 OT Time Calculation (min): 23 min Charges:  OT General Charges $OT Visit: 1 Procedure OT Evaluation $Initial OT Evaluation Tier I: 1 Procedure OT Treatments $Self Care/Home Management : 8-22 mins G-Codes:    Helaman Mecca M 03/25/2014, 11:52 AM

## 2014-03-25 NOTE — Progress Notes (Signed)
Pt ambulated in room and to bathroom approx 30 feet with brace and walker. Pt tolerated well. Will monitor Andrew AuVafiadis, Marveen Donlon I 03/25/2014 12:21 PM

## 2014-03-25 NOTE — Progress Notes (Signed)
Patient ID: Kelli FetterChristine E Barnett, female   DOB: 07/27/1975, 39 y.o.   MRN: [id number] Vital signs are stable Patient reports much more soreness in the back and in the lower extremities Motor function appears intact Gait is intact also Dressing remains clean and dry Will mobilize patient as tolerated Start short course of Decadron taper

## 2014-03-25 NOTE — Progress Notes (Signed)
Pt ambulated around nursing station approx 300 feet with brace and walker. Pt tolerated well.  Pt demonstrated good technique of placing/remving brace. Will monitor   Andrew AuVafiadis, Signa Cheek I .03/25/2014 3:16 PM

## 2014-03-25 NOTE — Progress Notes (Signed)
Pt ambulated in room and to bathroom approx 30 feet with brace and walker. Pt tolerated well. Will monitor   Andrew AuVafiadis, Makynleigh Breslin I 03/25/2014 11:51 AM

## 2014-03-26 MED ORDER — POLYETHYLENE GLYCOL 3350 17 G PO PACK
17.0000 g | PACK | Freq: Every day | ORAL | Status: DC
  Administered 2014-03-26 – 2014-03-27 (×2): 17 g via ORAL
  Filled 2014-03-26: qty 1

## 2014-03-26 NOTE — Progress Notes (Signed)
Occupational Therapy Treatment Patient Details Name: Kelli Barnett MRN: [id number] DOB: [birth year] Today's Date: 03/26/2014    History of present illness s/p L5-S1 decompression/fusion  PMHx- lumbar fracture, discectomy 09/2013, gastric bypass   OT comments  Patient overall independent>mod I, goals met. Discharging patient from acute OT services at this time.   Follow Up Recommendations  No OT follow up;Supervision - Intermittent    Equipment Recommendations  3 in 1 bedside comode    Recommendations for Other Services  None at this time    Precautions / Restrictions Precautions Precautions: Back Precaution Comments: Able to verbalize and adhere 3/3 back precautions independently. Required Braces or Orthoses: Spinal Brace Spinal Brace: Lumbar corset;Applied in sitting position Restrictions Weight Bearing Restrictions: No       Mobility Bed Mobility Overal bed mobility: Modified Independent  Transfers Overall transfer level: Modified independent    Balance Overall balance assessment: No apparent balance deficits (not formally assessed)    ADL General ADL Comments: Patient able to cross BLEs for LB ADLs. Patient independently performing functional mobility and transfers without use of RW. Patient states she will use RW for longer distances.      Cognition   Behavior During Therapy: WFL for tasks assessed/performed Overall Cognitive Status: Within Functional Limits for tasks assessed                 Pertinent Vitals/ Pain       Pain Assessment: Faces Faces Pain Scale: Hurts little more Pain Location: back during stair training Pain Descriptors / Indicators: Grimacing Pain Intervention(s): Monitored during session;Repositioned         Frequency Min 2X/week     Progress Toward Goals  OT Goals(current goals can now be found in the care plan section)  Progress towards OT goals: Goals met/education completed, patient discharged from [place]  Discharge plan remains appropriate       End of Session Equipment Utilized During Treatment: Back brace;Rolling walker   Activity Tolerance Patient tolerated treatment well   Patient Left Other (comment);with call bell/phone within reach (up and about in room, ambulating > BR)   Nurse Communication          Time: 8127-5170 OT Time Calculation (min): 12 min  Charges: OT General Charges $OT Visit: 1 Procedure OT Treatments $Therapeutic Activity: 8-22 mins  Kiandria Clum , MS, OTR/L, CLT Pager: [phone]  03/26/2014, 11:33 AM

## 2014-03-26 NOTE — Progress Notes (Signed)
Occupational Therapy Discharge Patient Details Name: Kelli Barnett MRN: [id number] DOB: [birth year] Today's Date: 03/26/2014 Time: 1594-5859 OT Time Calculation (min): 12 min  Patient discharged from OT services secondary to goals met and no further OT needs identified.  Please see latest therapy progress note for current level of functioning and progress toward goals.    Progress and discharge plan discussed with patient and/or caregiver: Patient/Caregiver agrees with plan   Please re-order OT if needed. Thanks.    Odie Rauen , MS, OTR/L, CLT Pager: [phone]  03/26/2014, 11:35 AM

## 2014-03-26 NOTE — Progress Notes (Signed)
Patient ID: Kelli Barnett, female   DOB: 09-Nov-1975, 39 y.o.   MRN: [id number] Vital signs are stable. Patient is ambulatory Pain under better control though still has concerns about needing parenteral IV pain medication.  We will reduce IV narcotic analgesics hopeful for discharge tomorrow.

## 2014-03-27 MED ORDER — DEXAMETHASONE 1 MG PO TABS: ORAL_TABLET | ORAL | Status: DC

## 2014-03-27 MED ORDER — OXYCODONE HCL 5 MG PO TABS: 5.0000 mg | ORAL_TABLET | ORAL | Status: DC | PRN

## 2014-03-27 MED ORDER — METHOCARBAMOL 500 MG PO TABS: 500.0000 mg | ORAL_TABLET | Freq: Four times a day (QID) | ORAL | Status: DC | PRN

## 2014-03-27 MED ORDER — OXYCODONE-ACETAMINOPHEN 10-325 MG PO TABS: 1.0000 | ORAL_TABLET | ORAL | Status: DC | PRN

## 2014-03-27 NOTE — Discharge Summary (Signed)
Physician Discharge Summary  Patient ID: Kelli FetterChristine E Routt MRN: [id number] DOB/AGE: [birth year] 39 y.o.  Admit date: 03/23/2014 Discharge date: 03/27/2014  Admission Diagnoses: Spondylosis radiculopathy L5-S1  Discharge Diagnoses: Spondylosis with radiculopathy L5-S1. Active Problems:   Spondylosis of lumbosacral region   Discharged Condition: good  Hospital Course: Patient was omitted to undergo surgical decompression at L5-S1 with posterior lumbar interbody fusion L5-S1 pedicular hardware was used. She tolerated surgery well. Pain management issues were addressed.  Consults: None  Significant Diagnostic Studies: None  Treatments: surgery: Bilateral laminectomy L5-S1 decompression of L5 and S1 nerve roots posterior lumbar interbody arthrodesis using peek spacers local autograft and allograft. Pedicle screw fixation L5-S1 with posterior lateral arthrodesis L5-S1.  Discharge Exam: Blood pressure 100/71, pulse 90, temperature 98 F (36.7 C), temperature source Oral, resp. rate 20, height 5' 9.5" (1.765 m), weight 108.41 kg (239 lb), last menstrual period 03/04/2014, SpO2 100 %. Incision is clean and dry, motor function is intact in lower extremities.  Disposition: 01-Home or Self Care  Discharge Instructions    Call MD for:  redness, tenderness, or signs of infection (pain, swelling, redness, odor or green/yellow discharge around incision site)    Complete by:  As directed      Call MD for:  severe uncontrolled pain    Complete by:  As directed      Call MD for:  temperature >100.4    Complete by:  As directed      Diet - low sodium heart healthy    Complete by:  As directed      Discharge instructions    Complete by:  As directed   Okay to shower. Do not apply salves or appointments to incision. No heavy lifting with the upper extremities greater than 15 pounds. May resume driving when not requiring pain medication and patient feels comfortable with doing so.     Increase  activity slowly    Complete by:  As directed             Medication List    TAKE these medications        dexamethasone 1 MG tablet  Commonly known as:  DECADRON  2 tablets twice daily for 2 days, one tablet twice daily for 2 days, one tablet daily for 2 days.     ibuprofen 200 MG tablet  Commonly known as:  ADVIL,MOTRIN  Take 600 mg by mouth as needed.     meloxicam 7.5 MG tablet  Commonly known as:  MOBIC  Take 7.5 mg by mouth 2 (two) times daily.     methocarbamol 500 MG tablet  Commonly known as:  ROBAXIN  Take 500 mg by mouth every 6 (six) hours as needed for muscle spasms.     methocarbamol 500 MG tablet  Commonly known as:  ROBAXIN  Take 1 tablet (500 mg total) by mouth every 6 (six) hours as needed for muscle spasms.     norgestrel-ethinyl estradiol 0.3-30 MG-MCG tablet  Commonly known as:  LO/OVRAL,CRYSELLE  Take 1 tablet by mouth daily.     ondansetron 4 MG tablet  Commonly known as:  ZOFRAN  Take 4 mg by mouth every 8 (eight) hours as needed for nausea or vomiting.     oxyCODONE-acetaminophen 10-325 MG per tablet  Commonly known as:  PERCOCET  Take 1 tablet by mouth every 6 (six) hours as needed for pain.     oxyCODONE-acetaminophen 10-325 MG per tablet  Commonly known as:  PERCOCET  Take 1  tablet by mouth every 4 (four) hours as needed for pain.     predniSONE 10 MG tablet  Commonly known as:  DELTASONE  Take 2 tablets (20 mg total) by mouth daily.         SignedStefani Dama 03/27/2014, 4:34 PM

## 2014-03-27 NOTE — Progress Notes (Signed)
  CARE MANAGEMENT ED NOTE 03/27/2014  Patient:  Kelli Barnett,Kelli Barnett   Account Number:  [id number]  Date Initiated:  03/27/2014  Documentation initiated by:  Radford PaxFERRERO,Shaylea Ucci  Subjective/Objective Assessment:   [facility]     Subjective/Objective Assessment Detail:     Action/Plan:   Action/Plan Detail:   Anticipated DC Date:  03/27/2014     Status Recommendation to Physician:   Result of Recommendation:    Other ED Services  Consult Working Plan    DC Planning Services  CM consult  Other   [facility]  DURABLE MEDICAL EQUIP[facility]   Choice offered to / List presented to:    DME arranged  3-N-1  Levan HurstWALKER - ROLLING     DME agency  [facility]        Status of service:  Completed, signed off  ED Comments:   ED Comments Detail:  [facility] regarding home DME, rolling walker and 3 in 1.  EDCM spke to patient via telephone.  Patient reports if equipment is not delivered this evening, it would not be a problem.  Patient reports her husband will be with her.  Patient agreeable to have equipment delivered to her home.  Patient provided [facility] phonenumber [phone].  Per 4 [employer] RN, patient does not require home health services.  [facility] rolling walker and 3 [facility] at 1812pm with confirmation of receipt at 1814pm.   No further [facility] needs at this time.

## 2014-07-23 ENCOUNTER — Ambulatory Visit: Payer: BLUE CROSS/BLUE SHIELD | Attending: Neurological Surgery | Admitting: Physical Therapy

## 2014-07-23 DIAGNOSIS — M5416 Radiculopathy, lumbar region: Secondary | ICD-10-CM | POA: Insufficient documentation

## 2014-07-23 NOTE — Therapy (Signed)
[facility] [address]  [address] [place], [place], [address] Phone: [phone]   Fax:  [phone]  Physical Therapy Evaluation  Patient Details  Name: Kelli Barnett MRN: [id number] Date of Birth: 01-18-1975 Referring Provider:  Barnett Abu, MD  Encounter Date: 07/23/2014      PT End of Session - 07/23/14 1754    Visit Number 1   Number of Visits 12   Date for PT Re-Evaluation 09/03/14   PT Start Time 1530   PT Stop Time 1617   PT Time Calculation (min) 47 min   Activity Tolerance Patient tolerated treatment well   Behavior During Therapy [facility] for tasks assessed/performed      Past Medical History  Diagnosis Date  . Lumbar vertebral fracture   . Heart murmur     child echo done as child  . Depression     no meds few yrs  . Seizures     hx epilepsy last seizure 39 yrs old  . Headache   . Anemia     hx    Past Surgical History  Procedure Laterality Date  . Gastric bypass  2009  . Ulnar nerve transposition Left 03  . Cholecystectomy    . Back surgery  15  . Ganglion cyst excision Right     wrist    There were no vitals filed for this visit.  Visit Diagnosis:  Lumbar radiculopathy - Plan: PT plan of care cert/re-cert      Subjective Assessment - 07/23/14 1536    Subjective Patient with c/o LBP starting 09/2013 initially with R sciatica, presently extending into bilateral hips. Patient anxious to return to work, but concerned about diffculty with prolonged sitting necessary for completetion of job tasks. Also reports difficulty with prolonged standing, walking and climbing stairs.   Pertinent History L5-S1 fusion on 03/23/2014   Limitations Sitting;Standing;Walking   How long can you sit comfortably? 20 min   How long can you stand comfortably? 20 min   How long can you walk comfortably? 1/4 mile   Patient Stated Goals "To get back to work"   Currently in Pain? Yes   Pain Score 3   Least 2/10,  Average 4/5, Worst 8/10   Pain Location Back   Pain Orientation Lower;Medial   Pain Descriptors / Indicators Cramping;Stabbing   Pain Type Chronic pain   Pain Radiating Towards to both hips   Pain Onset More than a month ago  Initial onset ~3 years ago   Pain Frequency Constant   Aggravating Factors  prolonged sitting, standing, walking; climbing stairs   Pain Relieving Factors lying down   Effect of Pain on Daily Activities interferes with abilty to return to work, travel, leisure (going to movies)   Multiple Pain Sites No            [facility] PT Assessment - 07/23/14 0001    Assessment   Medical Diagnosis Lumbar radiculopathy   Onset Date/Surgical Date 03/23/14   Next MD Visit 08/26/2014   Prior Therapy none   Precautions   Precautions None   Restrictions   Weight Bearing Restrictions No   Balance Screen   Has the patient fallen in the past 6 months No   Has the patient had a decrease in activity level because of a fear of falling?  No   Is the patient reluctant to leave their home because of a fear of falling?  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   Leisure observation of sporting events   Observation/Other Assessments   Focus on Therapeutic Outcomes (FOTO)  44% (56% limited); predicted improvement 56% (44% limited)   Posture/Postural Control   Posture/Postural Control Postural limitations   Postural Limitations Posterior pelvic tilt  flattened L-spine   ROM / Strength   AROM / PROM / Strength  AROM;Strength   AROM   Lumbar Flexion hands to mid shin, no change in pain   Lumbar Extension 75% with slight increased pain   Lumbar - Right Side Bend hand to knee, no change in pain   Lumbar - Left Side Bend hand to knee, no change in pain   Lumbar - Right Rotation WNL w/o c/o pain   Lumbar - Left Rotation WNL w/o c/o pain   Strength   Overall Strength Within functional limits for tasks performed  LE's - slight increased pain w/ resisted bilateral hip ext   Flexibility   Hamstrings tightness limiting SLR to ~60 degrees bilaterally before knee flexion                   [facility] Adult PT Treatment/Exercise - 07/23/14 0001    Exercises   Exercises Lumbar   Lumbar Exercises: Stretches   Active Hamstring Stretch 20 seconds;3 reps  bilateral   Single Knee to Chest Stretch 20 seconds;3 reps   Pelvic Tilt --  15 reps, 5 sec hold   Prone on Elbows Stretch 60 seconds;1 rep                PT Education - 07/23/14 1754    Education provided Yes   Education Details Initial HEP   Person(s) Educated Patient   Methods Explanation;Demonstration;Handout   Comprehension Verbalized understanding;Returned demonstration;Need further instruction          PT Short Term Goals - 07/23/14 1818    PT SHORT TERM GOAL #1   Title Independent with HEP (08/06/14)   Time 2   Period Weeks   Status New           PT Long Term Goals - 07/23/14 1819    PT LONG TERM GOAL #1   Title Patient displays improved sitting and standing posture with at least some lumbar lordosis/pelvic anterior tilt past flat (09/03/14)   Time 6   Period Weeks   Status New   PT LONG TERM GOAL #2   Title Lumbar/LE flexibility WFL w/o increased pain (09/03/14)   Time 6   Period Weeks   Status New   PT LONG TERM GOAL #3   Title Able to return to work w/o LBP greater than 2/10 (09/03/14)   Time 6   Period Weeks   Status New   PT LONG TERM GOAL #4   Title Able to walk 1 mile w/o increased pain (09/03/14)    Time 6   Period Days   Status New               Plan - 07/23/14 1806  Clinical Impression Statement Patient referred to OPPT for LBP with radiculopathy which persists after L5-S1 fusion on 03/23/2014. Pain limiting daily activities, especially activities requiring prolonged positioning ro walking, and preventing patient from returning to work.   Pt will benefit from skilled therapeutic intervention in order to improve on the following deficits Pain;Postural dysfunction;Improper body mechanics;Impaired flexibility;Decreased activity tolerance;Decreased mobility   Rehab Potential Good   PT Frequency 2x / week   PT Duration 6 weeks   PT Treatment/Interventions Therapeutic exercise;Therapeutic activities;Manual techniques;Functional mobility training;Neuromuscular re-education;Electrical Stimulation;Moist Heat;Cryotherapy;ADLs/Self Care Home Management;Patient/family education   PT Next Visit Plan lumbar flexibility & strengthening exercises, postural training, modalities PRN   PT Home Exercise Plan HEP issued   Consulted and Agree with Plan of Care Patient         Problem List Patient Active Problem List   Diagnosis Date Noted  . Spondylosis of lumbosacral region 03/23/2014    Marry GuanJoAnne M Kent Riendeau, PT, MPT 07/23/2014, 6:39 PM  [facility] [place] [address]  [address] [place], [place], [address] Phone: [phone]   Fax:  [phone]

## 2014-07-28 ENCOUNTER — Ambulatory Visit: Payer: BLUE CROSS/BLUE SHIELD | Admitting: Rehabilitation

## 2014-07-30 ENCOUNTER — Ambulatory Visit: Payer: BLUE CROSS/BLUE SHIELD | Admitting: Physical Therapy

## 2014-07-30 DIAGNOSIS — M5416 Radiculopathy, lumbar region: Secondary | ICD-10-CM

## 2014-07-30 NOTE — Therapy (Signed)
[facility] [address]  [address] [place], [place], [address] Phone: [phone]   Fax:  [phone]  Physical Therapy Treatment  Patient Details  Name: Kelli Barnett MRN: [id number] Date of Birth: [birth year] Referring Provider:  Barnett Abu, MD  Encounter Date: 07/30/2014      PT End of Session - 07/30/14 1515    Visit Number 2   Number of Visits 12   Date for PT Re-Evaluation 09/03/14   PT Start Time 1402   PT Stop Time 1450   PT Time Calculation (min) 48 min   Activity Tolerance Patient tolerated treatment well   Behavior During Therapy [facility] for tasks assessed/performed      Past Medical History  Diagnosis Date  . Lumbar vertebral fracture   . Heart murmur     child echo done as child  . Depression     no meds few yrs  . Seizures     hx epilepsy last seizure 39 yrs old  . Headache   . Anemia     hx    Past Surgical History  Procedure Laterality Date  . Gastric bypass  2009  . Ulnar nerve transposition Left 03  . Cholecystectomy    . Back surgery  15  . Ganglion cyst excision Right     wrist    There were no vitals filed for this visit.  Visit Diagnosis:  Lumbar radiculopathy      Subjective Assessment - 07/30/14 1409    Subjective Patient reports more sore today. Attributes pain to being much more active this week than previously (more sitting, standing, walking, bending with goin to lay down to rest). Patient stating plan to return to work on 08/17/14.   Currently in Pain? Yes   Pain Score 6   Averaging between 4-6   Pain Location Back   Pain Orientation Left;Lower   Pain Descriptors / Indicators Sharp  localized   Pain Frequency Constant   Multiple Pain Sites No               [facility] Adult PT Treatment/Exercise - 07/30/14 1415    Lumbar Exercises: Stretches   Active Hamstring Stretch 20 seconds;3 reps  bilateral   Active Hamstring Stretch Limitations requires cue for 90  degree hip flexion   Single Knee to Chest Stretch 20 seconds;3 reps   Lower Trunk Rotation 5 reps;10 seconds   Prone on Elbows Stretch Other (comment)  2 minutes   Prone Mid Back Stretch 2 reps   Lumbar Exercises: Aerobic   UBE (Upper Arm Bike) lvl 1.5, fwd/back 3 min ea   Lumbar Exercises: Standing   Row Both;15 reps;Theraband   Theraband Level (Row) Level 2 (Red)   Lumbar Exercises: Supine   Ab Set 15 reps;5 seconds   Bent Knee Raise 10 reps;2 seconds  2 sets, 2nd set with alternate hand to knee   Bridge 10 reps;3 seconds   Bridge Limitations mild increased pain in left low back   Lumbar Exercises: Quadruped   Madcat/Old Horse 10 reps                PT Education - 07/30/14 1512    Education provided Yes   Education Details Updated HEP   Person(s) Educated Patient   Methods Explanation;Demonstration;Handout   Comprehension Verbalized understanding;Returned demonstration;Need further instruction          PT Short Term Goals - 07/30/14 1521    PT SHORT TERM GOAL #  1   Title Independent with HEP (08/06/14)   Status On-going           PT Long Term Goals - 07/30/14 1521    PT LONG TERM GOAL #1   Title Patient displays improved sitting and standing posture with at least some lumbar lordosis/pelvic anterior tilt past flat (09/03/14)   Status On-going   PT LONG TERM GOAL #2   Title Lumbar/LE flexibility WFL w/o increased pain (09/03/14)   Status On-going   PT LONG TERM GOAL #3   Title Able to return to work w/o LBP greater than 2/10 (09/03/14)   Status On-going   PT LONG TERM GOAL #4   Title Able to walk 1 mile w/o increased pain (09/03/14)   Status On-going               Plan - 07/30/14 1516    Clinical Impression Statement  Patient with increased report of pain associated with significantly increased activity over past week. Pain not limiting therapy tolerance and able to progress exercises.   PT Next Visit Plan lumbar flexibility & strengthening  exercises, postural training, modalities PRN   Consulted and Agree with Plan of Care Patient        Problem List Patient Active Problem List   Diagnosis Date Noted  . Spondylosis of lumbosacral region 03/23/2014    Marry GuanJoAnne M Yamil Oelke, PT, MPT 07/30/2014, 3:24 PM  [facility] [place] [address]  [address] [place], [place], [address] Phone: [phone]   Fax:  [phone]

## 2014-08-04 ENCOUNTER — Ambulatory Visit: Payer: BLUE CROSS/BLUE SHIELD | Admitting: Rehabilitation

## 2014-08-05 ENCOUNTER — Ambulatory Visit: Payer: BLUE CROSS/BLUE SHIELD | Admitting: Rehabilitation

## 2014-08-06 ENCOUNTER — Ambulatory Visit: Payer: BLUE CROSS/BLUE SHIELD | Admitting: Physical Therapy

## 2014-08-06 DIAGNOSIS — M5416 Radiculopathy, lumbar region: Secondary | ICD-10-CM

## 2014-08-06 NOTE — Therapy (Signed)
[facility] [address]  [address] [place], [place], [address] Phone: [phone]   Fax:  [phone]  Physical Therapy Treatment  Patient Details  Name: CLOTILDA HAFER MRN: [id number] Date of Birth: December 19, 1975 Referring Provider:  Barnett Abu, MD  Encounter Date: 08/06/2014      PT End of Session - 08/06/14 1450    Visit Number 3   Number of Visits 12   Date for PT Re-Evaluation 09/03/14   PT Start Time 1405   PT Stop Time 1446   PT Time Calculation (min) 41 min   Activity Tolerance Patient tolerated treatment well   Behavior During Therapy [facility] for tasks assessed/performed      Past Medical History  Diagnosis Date  . Lumbar vertebral fracture   . Heart murmur     child echo done as child  . Depression     no meds few yrs  . Seizures     hx epilepsy last seizure 39 yrs old  . Headache   . Anemia     hx    Past Surgical History  Procedure Laterality Date  . Gastric bypass  2009  . Ulnar nerve transposition Left 03  . Cholecystectomy    . Back surgery  15  . Ganglion cyst excision Right     wrist    There were no vitals filed for this visit.  Visit Diagnosis:  Lumbar radiculopathy      Subjective Assessment - 08/06/14 1408    Subjective Patient reports MD released her to go back to work on 08/17/14. Reports pain/stiffness much better since starting therapy, allowing her to get more done.   Currently in Pain? Yes   Pain Score 2    Pain Location Back   Pain Orientation Mid;Lower   Pain Descriptors / Indicators Dull;Tightness   Multiple Pain Sites No                  [facility] Adult PT Treatment/Exercise - 08/06/14 1410    Exercises   Exercises Lumbar   Lumbar Exercises: Stretches   Active Hamstring Stretch 20 seconds;3 reps  bilateral   Single Knee to Chest Stretch 20 seconds;3 reps;1 rep   Double Knee to Chest Stretch 20 seconds;3 reps   Lower Trunk Rotation 5 reps;10 seconds   Prone on Elbows Stretch 30 seconds;3 reps   Prone on Elbows Stretch Limitations rest position btw press-ups   Press Ups 30 seconds;2 reps   Press Ups Limitations POE rest position btw reps   Prone Mid Back Stretch 2 reps   Prone Mid Back Stretch Limitations prayer stretch L/C/R with 20 sec hold each   Lumbar Exercises: Aerobic   UBE (Upper Arm Bike) lvl 1.5, fwd/back 3 min ea   Lumbar Exercises: Standing   Functional Squats 15 reps;2 seconds   Functional Squats Limitations TRX   Row Both;15 reps;Theraband   Theraband Level (Row) Level 3 (Green)   Shoulder Extension Both;15 reps;Theraband   Theraband Level (Shoulder Extension) Level 3 (Green)   Lumbar Exercises: Supine   Ab Set 15 reps;5 seconds   Clam 15 reps;3 seconds   Clam Limitations with green TB   Bent Knee Raise 10 reps;2 seconds  with alternate hand to knee   Lumbar Exercises: Quadruped   Madcat/Old Horse 10 reps   Single Arm Raise Right;Left;10 reps;2 seconds   Single Arm Raise Weights (lbs) 0#   Single Arm Raises Limitations alternating arms  PT Education - 08/06/14 1449    Education provided Yes   Education Details Provided green TB for progression of HEP          PT Short Term Goals - 08/06/14 1457    PT SHORT TERM GOAL #1   Title Independent with HEP (08/06/14)   Status Achieved           PT Long Term Goals - 08/06/14 1457    PT LONG TERM GOAL #1   Title Patient displays improved sitting and standing posture with at least some lumbar lordosis/pelvic anterior tilt past flat (09/03/14)   Status On-going   PT LONG TERM GOAL #2   Title Lumbar/LE flexibility WFL w/o increased pain (09/03/14)   Status On-going   PT LONG TERM GOAL #3   Title Able to return to work w/o LBP greater than 2/10 (09/03/14)   Status On-going   PT LONG TERM GOAL #4   Title Able to walk 1 mile w/o increased pain (09/03/14)   Status On-going               Plan - 08/06/14 1451    Clinical Impression  Statement Patient with improving low back pain with decreased radicular symptoms allowing for increased activity tolerance during exercises and throughout daily activites. Given reduced pain and increased activity tolerance, patient has received clearance from MD to return to work on 08/17/14.   PT Next Visit Plan lumbar flexibility & strengthening exercises, postural training, body mechanics awareness, modalities PRN   Consulted and Agree with Plan of Care Patient        Problem List Patient Active Problem List   Diagnosis Date Noted  . Spondylosis of lumbosacral region 03/23/2014    Marry Guan, PT, MPT 08/06/2014, 3:00 PM  [facility] [address]  [address] [place], [place], [address] Phone: [phone]   Fax:  [phone]

## 2014-08-10 ENCOUNTER — Ambulatory Visit: Payer: BLUE CROSS/BLUE SHIELD | Admitting: Physical Therapy

## 2014-08-11 ENCOUNTER — Ambulatory Visit: Payer: BLUE CROSS/BLUE SHIELD | Admitting: Rehabilitation

## 2014-08-12 ENCOUNTER — Ambulatory Visit: Payer: BLUE CROSS/BLUE SHIELD | Admitting: Rehabilitation

## 2014-08-13 ENCOUNTER — Ambulatory Visit: Payer: BLUE CROSS/BLUE SHIELD | Admitting: Physical Therapy

## 2014-08-18 ENCOUNTER — Ambulatory Visit: Payer: BLUE CROSS/BLUE SHIELD | Attending: Neurological Surgery | Admitting: Physical Therapy

## 2014-08-18 DIAGNOSIS — M5416 Radiculopathy, lumbar region: Secondary | ICD-10-CM | POA: Diagnosis not present

## 2014-08-18 NOTE — Therapy (Signed)
[facility] [address]  [place], [place], [address] Phone: [phone]   Fax:  [phone]  Physical Therapy Treatment  Patient Details  Name: Kelli Barnett MRN: [id number] Date of Birth: [birth year] Referring Provider:  Kristeen Miss, MD  Encounter Date: 08/18/2014      PT End of Session - 08/18/14 1412    Visit Number 4   Number of Visits 12   Date for PT Re-Evaluation 09/03/14   PT Start Time 0355   PT Stop Time 1449   PT Time Calculation (min) 47 min   Activity Tolerance Patient tolerated treatment well   Behavior During Therapy [facility] for tasks assessed/performed      Past Medical History  Diagnosis Date  . Lumbar vertebral fracture   . Heart murmur     child echo done as child  . Depression     no meds few yrs  . Seizures     hx epilepsy last seizure 39 yrs old  . Headache   . Anemia     hx    Past Surgical History  Procedure Laterality Date  . Gastric bypass  2009  . Ulnar nerve transposition Left 03  . Cholecystectomy    . Back surgery  15  . Ganglion cyst excision Right     wrist    There were no vitals filed for this visit.  Visit Diagnosis:  Lumbar radiculopathy      Subjective Assessment - 08/18/14 1406    Subjective Patient admits she hasn't done her HEP in several days secondary to traveling out of town, river tubing, and starting back to work yesterday. Reports back pain aggravated by riding in car and tubing trip but has settled back down. Able to start back to work yesterday. States day went well with use of pain meds and intermittent breaks to get up and walk around. Lumbar support brought to work today to promote improved posture/alignment in work chair with patient able to tolerate sitting ~1.5-2 hrs before needing to get up and move around.   How long can you sit comfortably? 1.5-2 hrs   How long can you stand comfortably? 15-20 minutes   How long can you walk  comfortably? 1/4-1/2 mile   Currently in Pain? Yes   Pain Score 2    Pain Location Back   Pain Orientation Mid;Lower            [facility] PT Assessment - 08/18/14 1445    Assessment   Next MD Visit 08/25/2014   AROM   Lumbar Flexion WFL, no pain   Lumbar Extension WFL, no pain   Lumbar - Right Side Bend hand to knee, no pain/mild tightness   Lumbar - Left Side Bend hand to knee, no pain   Lumbar - Right Rotation WNL w/o c/o pain   Lumbar - Left Rotation WNL w/o c/o pain                     [facility] Adult PT Treatment/Exercise - 08/18/14 1412    Therapeutic Activites    Therapeutic Activities Work Simulation   Work Simulation single leg 1/2 kneel in place of deep squat for accessing lower file drawers at work   Exercises   Exercises Lumbar   Lumbar Exercises: Stretches   Lower Trunk Rotation 5 reps;10 seconds   Lower Trunk Rotation Limitations LE's on orange (55 cm) Tball   Lumbar Exercises: Aerobic   UBE (Upper  Arm Bike) lvl 2, fwd/back 3 min ea   Lumbar Exercises: Machines for Strengthening   Other Lumbar Machine Exercise Cybex Low Row 25# x10, 35# x10   Lumbar Exercises: Standing   Wall Slides 5 seconds;5 reps   Wall Slides Limitations stopped after 4 reps secondary to quad fatigue    Row Right;Left;15 reps;Theraband   Theraband Level (Row) Level 4 (Blue)   Row Limitations crossbody   Lumbar Exercises: Supine   Clam 15 reps;3 seconds   Clam Limitations with green TB   Bridge 15 reps;3 seconds   Bridge Limitations TrA + small ball between knees   Lumbar Exercises: Quadruped   Single Arm Raise Right;Left;10 reps;3 seconds   Single Arm Raise Weights (lbs) 0#   Single Arm Raises Limitations alternating UE   Straight Leg Raise 10 reps;3 seconds   Straight Leg Raises Limitations alternating LE's                  PT Short Term Goals - 08/06/14 1457    PT SHORT TERM GOAL #1   Title Independent with HEP (08/06/14)   Status Achieved           PT  Long Term Goals - 08/18/14 1755    PT LONG TERM GOAL #1   Title Patient displays improved sitting and standing posture with at least some lumbar lordosis/pelvic anterior tilt past flat (09/03/14)   Status On-going  Patient using lumbar support for desk chair   PT LONG TERM GOAL #2   Title Lumbar/LE flexibility WFL w/o increased pain (09/03/14)   Status Partially Met  WFL with no pain, but mild tightness with R SB   PT LONG TERM GOAL #3   Title Able to return to work w/o LBP greater than 2/10 (09/03/14)   Status On-going  Returned to work with avg pain 3-4/10   PT LONG TERM GOAL #4   Title Able to walk 1 mile w/o increased pain (09/03/14)   Status On-going  Not yet attempted               Plan - 08/18/14 1613    Clinical Impression Statement Patient returned to work yesterday with pain controlled with lumbar support to maintain occasional strteching and positional changes. Patient did report difficulty with returning to stand from deep squat when reaching down to lower file drawers, with instructions provided for modified approach using single leg 1/2 kneel. Patient questioning readiness for discharge, therefore reviewed goal status with majority of goals partially or nearly met; with exception of patient not yet attempting longer walks. Patient to attempt this and report back at next visit.   PT Next Visit Plan lumbar flexibility & strengthening exercises, postural training, body mechanics awareness, modalities PRN; possible D/C pending goal progress and patient preference   Consulted and Agree with Plan of Care Patient        Problem List Patient Active Problem List   Diagnosis Date Noted  . Spondylosis of lumbosacral region 03/23/2014    Percival Spanish, PT, MPT 08/18/2014, 6:24 PM  [facility] [address]  [address] [place], [place], [address] Phone: [phone]   Fax:  [phone]

## 2014-08-20 ENCOUNTER — Ambulatory Visit: Payer: BLUE CROSS/BLUE SHIELD | Admitting: Physical Therapy

## 2014-08-20 DIAGNOSIS — M5416 Radiculopathy, lumbar region: Secondary | ICD-10-CM

## 2014-08-20 NOTE — Therapy (Signed)
[facility] [address]  [place], [place], [address] Phone: [phone]   Fax:  [phone]  Physical Therapy Treatment  Patient Details  Name: Kelli Barnett MRN: [id number] Date of Birth: [birth year] Referring Provider:  Kristeen Miss, MD  Encounter Date: 08/20/2014      PT End of Session - 08/20/14 1444    Visit Number 5   Number of Visits 12   Date for PT Re-Evaluation 09/03/14   PT Start Time 1761   PT Stop Time 1449   PT Time Calculation (min) 46 min   Activity Tolerance Patient tolerated treatment well   Behavior During Therapy [facility] for tasks assessed/performed      Past Medical History  Diagnosis Date  . Lumbar vertebral fracture   . Heart murmur     child echo done as child  . Depression     no meds few yrs  . Seizures     hx epilepsy last seizure 39 yrs old  . Headache   . Anemia     hx    Past Surgical History  Procedure Laterality Date  . Gastric bypass  2009  . Ulnar nerve transposition Left 03  . Cholecystectomy    . Back surgery  15  . Ganglion cyst excision Right     wrist    There were no vitals filed for this visit.  Visit Diagnosis:  Lumbar radiculopathy      Subjective Assessment - 08/20/14 1405    Subjective Patient states she [place] with mild stiffness but no pain. Feels like walk and combination of HEP progression and being back to work may have overdone things, but overall is very pleased with her progress so far.   How long can you walk comfortably? 30-45 minutes   Currently in Pain? Yes   Pain Score 2    Pain Location Back   Pain Orientation Mid;Lower   Pain Descriptors / Indicators Cramping                 [facility] Adult PT Treatment/Exercise - 08/20/14 1409    Exercises   Exercises Lumbar   Lumbar Exercises: Stretches   Lower Trunk Rotation 5 reps;10 seconds   Lower Trunk Rotation Limitations LE's on orange (55  cm) Tball   Lumbar Exercises: Aerobic   UBE (Upper Arm Bike) lvl 2, fwd/back 3' each   Lumbar Exercises: Machines for Strengthening   Other Lumbar Machine Exercise Cybex Low Row 25# x15, 35# x15   Lumbar Exercises: Standing   Forward Lunge 10 reps;3 seconds   Forward Lunge Limitations slight pain with last 2 reps with left leg fwd, only tolerated 5 reps with right leg forward before pain started   Row Right;Left;15 reps;Theraband  2 sets   Theraband Level (Row) Level 4 (Blue)  2ns set with Black TB   Row Limitations crossbody   Lumbar Exercises: Sidelying   Clam 15 reps;3 seconds   Clam Limitations bilateral with Blue TB   Lumbar Exercises: Quadruped   Other Quadruped Lumbar Exercises Child's pose L/C/R with Green Tball x 5 each   Other Quadruped Lumbar Exercises Mid back extension on Green Tball x 10                  PT Short Term Goals - 08/06/14 1457    PT SHORT TERM GOAL #1   Title Independent with HEP (08/06/14)  Status Achieved           PT Long Term Goals - 08/18/14 1755    PT LONG TERM GOAL #1   Title Patient displays improved sitting and standing posture with at least some lumbar lordosis/pelvic anterior tilt past flat (09/03/14)   Status On-going  Patient using lumbar support for desk chair   PT LONG TERM GOAL #2   Title Lumbar/LE flexibility WFL w/o increased pain (09/03/14)   Status Partially Met  WFL with no pain, but mild tightness with R SB   PT LONG TERM GOAL #3   Title Able to return to work w/o LBP greater than 2/10 (09/03/14)   Status On-going  Returned to work with avg pain 3-4/10   PT LONG TERM GOAL #4   Title Able to walk 1 mile w/o increased pain (09/03/14)   Status On-going  Not yet attempted               Plan - 08/20/14 1449    Clinical Impression Statement Patient very pleased with progress to date and feels as if some of remaining limitations are the result of being inactive for such a long time. Tolerating up to 10 hr days  at work without significant pain interference, but still fatigues with activities such as walking. Reporting some increased pain with progression of exercises/therapeutic activities today. Patient would like to continue for another 1-2 wks to restore lumbar flexibility and stability and improve activity tolerance to limit pain with fatigue.   PT Next Visit Plan lumbar flexibility & strengthening exercises, postural training, body mechanics awareness, modalities PRN   Consulted and Agree with Plan of Care Patient        Problem List Patient Active Problem List   Diagnosis Date Noted  . Spondylosis of lumbosacral region 03/23/2014    Percival Spanish, PT, MPT 08/20/2014, 3:03 PM  [facility] [address]  [place], [place], [address] Phone: [phone]   Fax:  [phone]

## 2014-09-01 ENCOUNTER — Ambulatory Visit: Payer: BLUE CROSS/BLUE SHIELD | Admitting: Physical Therapy

## 2014-09-03 ENCOUNTER — Ambulatory Visit: Payer: BLUE CROSS/BLUE SHIELD | Admitting: Rehabilitation

## 2014-09-03 DIAGNOSIS — M5416 Radiculopathy, lumbar region: Secondary | ICD-10-CM

## 2014-09-03 NOTE — Therapy (Addendum)
[facility] [address]  [place], [place], [address] Phone: [phone]   Fax:  [phone]  Physical Therapy Treatment  Patient Details  Name: Kelli Barnett MRN: [id number] Date of Birth: Sep 01, 1975 Referring Provider:  Kristeen Miss, MD  Encounter Date: 09/03/2014      PT End of Session - 09/03/14 0807    Visit Number 6   Number of Visits 12   Date for PT Re-Evaluation 09/03/14   PT Start Time 0803   PT Stop Time 0841   PT Time Calculation (min) 38 min      Past Medical History  Diagnosis Date  . Lumbar vertebral fracture   . Heart murmur     child echo done as child  . Depression     no meds few yrs  . Seizures     hx epilepsy last seizure 39 yrs old  . Headache   . Anemia     hx    Past Surgical History  Procedure Laterality Date  . Gastric bypass  2009  . Ulnar nerve transposition Left 03  . Cholecystectomy    . Back surgery  15  . Ganglion cyst excision Right     wrist    There were no vitals filed for this visit.  Visit Diagnosis:  Lumbar radiculopathy      Subjective Assessment - 09/03/14 0805    Subjective Hasn't been here since 8/4 and reports she hasn't been doing HEP like she should due to work. States work has given her a new project which requires her to sit for 12 hours. She states she is trying to [facility] when she can but is able to sit for 12 hours. Notes increased stiffness this morning.    Currently in Pain? Yes   Pain Score 4   7/10 at worst, 3-4/10 on average   Pain Location Back   Pain Orientation Mid;Lower   Pain Descriptors / Indicators Sore            [facility] PT Assessment - 09/03/14 0835    AROM   Lumbar Flexion WFL, pain at full motion with pain on return   Lumbar Extension WFL, no pain   Lumbar - Right Side Bend hand to knee, slight pain   Lumbar - Left Side Bend hand to knee, no pain   Lumbar - Right Rotation WNL w/o c/o pain   Lumbar - Left  Rotation WNL w/o c/o pain                     [facility] Adult PT Treatment/Exercise - 09/03/14 0807    Exercises   Exercises Lumbar   Lumbar Exercises: Stretches   Single Knee to Chest Stretch 20 seconds;3 reps   Lower Trunk Rotation 5 reps;10 seconds   Lower Trunk Rotation Limitations LE's on orange (55 cm) Tball   Lumbar Exercises: Aerobic   UBE (Upper Arm Bike) lvl 2, fwd/back 3' each   Lumbar Exercises: Machines for Strengthening   Other Lumbar Machine Exercise --   Lumbar Exercises: Supine   Bridge 3 seconds  x6, stopped due to pain   Bridge Limitations TrA   Lumbar Exercises: Sidelying   Clam 15 reps;3 seconds   Clam Limitations bilateral with Blue TB, (pain on last rep on Lt)   Lumbar Exercises: Quadruped   Other Quadruped Lumbar Exercises Child's pose L/C/R x 3 each  PT Short Term Goals - 08/06/14 1457    PT SHORT TERM GOAL #1   Title Independent with HEP (08/06/14)   Status Achieved           PT Long Term Goals - 08/18/14 1755    PT LONG TERM GOAL #1   Title Patient displays improved sitting and standing posture with at least some lumbar lordosis/pelvic anterior tilt past flat (09/03/14)   Status On-going  Patient using lumbar support for desk chair   PT LONG TERM GOAL #2   Title Lumbar/LE flexibility WFL w/o increased pain (09/03/14)   Status Partially Met  WFL with no pain, but mild tightness with R SB   PT LONG TERM GOAL #3   Title Able to return to work w/o LBP greater than 2/10 (09/03/14)   Status On-going  Returned to work with avg pain 3-4/10   PT LONG TERM GOAL #4   Title Able to walk 1 mile w/o increased pain (09/03/14)   Status On-going  Not yet attempted               Plan - 09/03/14 0813    Clinical Impression Statement Decreased intensity today due to high pain levels/stiffness. Noted increase pain with bridges today so stopped that activity. Able to now tolerate 12 hour days while at work but does  stand to walk/move around when she can. Isn't sure how far/long she is able to walk but tries to walk 15-30 minutes during breaks. More pain noted with ROM testing today but pt not losing motion compared to last visit.    PT Next Visit Plan lumbar flexibility & strengthening exercises, postural training, body mechanics awareness, modalities PRN        Problem List Patient Active Problem List   Diagnosis Date Noted  . Spondylosis of lumbosacral region 03/23/2014    Barbette Hair, PTA 09/03/2014, 8:41 AM  [facility] [address]  [place], [place], [address] Phone: [phone]   Fax:  [phone]     PHYSICAL THERAPY DISCHARGE SUMMARY  Visits from Start of Care: 6  Current functional level related to goals / functional outcomes:  Patient had been able to return to work as of the beginning of August, tolerating up to 10 hr days at work without significant pain interference with pain reasonably well mitigated with use of lumbar cushion in chair, periodic standing and walking around, and continued performance of HEP until assigned a new project which required her to sit for 12 hour stretches and kept her from completing HEP. Patient failed to return for any further visits after this, therefore unable to assess status at discharge.   Remaining deficits:  Unable to assess, but continued low back pain as of last visit    Education / Equipment:  HEP  Plan: Patient agrees to discharge.  Patient goals were partially met. Patient is being discharged due to not returning since the last visit.  ?????       Percival Spanish, PT, MPT 10/20/2014, 12:02 PM  [facility] [address]  [place], [place], [address] Phone: [phone]   Fax:  [phone]

## 2014-09-07 ENCOUNTER — Ambulatory Visit: Payer: BLUE CROSS/BLUE SHIELD | Admitting: Rehabilitation

## 2014-09-10 ENCOUNTER — Ambulatory Visit: Payer: BLUE CROSS/BLUE SHIELD | Admitting: Physical Therapy

## 2014-10-19 ENCOUNTER — Ambulatory Visit: Payer: BLUE CROSS/BLUE SHIELD | Attending: Neurological Surgery | Admitting: Physical Therapy

## 2015-10-26 ENCOUNTER — Other Ambulatory Visit: Payer: Self-pay | Admitting: Obstetrics and Gynecology

## 2015-10-26 DIAGNOSIS — R1032 Left lower quadrant pain: Secondary | ICD-10-CM

## 2015-11-02 ENCOUNTER — Ambulatory Visit
Admission: RE | Admit: 2015-11-02 | Discharge: 2015-11-02 | Disposition: A | Discharge status: Home / Self Care | Payer: BLUE CROSS/BLUE SHIELD | Source: Ambulatory Visit | Attending: Obstetrics and Gynecology | Admitting: Obstetrics and Gynecology

## 2015-11-02 DIAGNOSIS — R1032 Left lower quadrant pain: Secondary | ICD-10-CM

## 2015-11-02 MED ORDER — IOPAMIDOL (ISOVUE-300) INJECTION 61%
100.0000 mL | Freq: Once | INTRAVENOUS | Status: AC | PRN
  Administered 2015-11-02: 100 mL via INTRAVENOUS

## 2016-05-11 IMAGING — CR DG LUMBAR SPINE 1V
1 series · 1 of 1 positions shown · non-contrast
Comparison: MRI a 11/06/2013

CLINICAL DATA: Posterior spinal fusion at L5-S1.

EXAM:
LUMBAR SPINE - 1 VIEW

[lat]
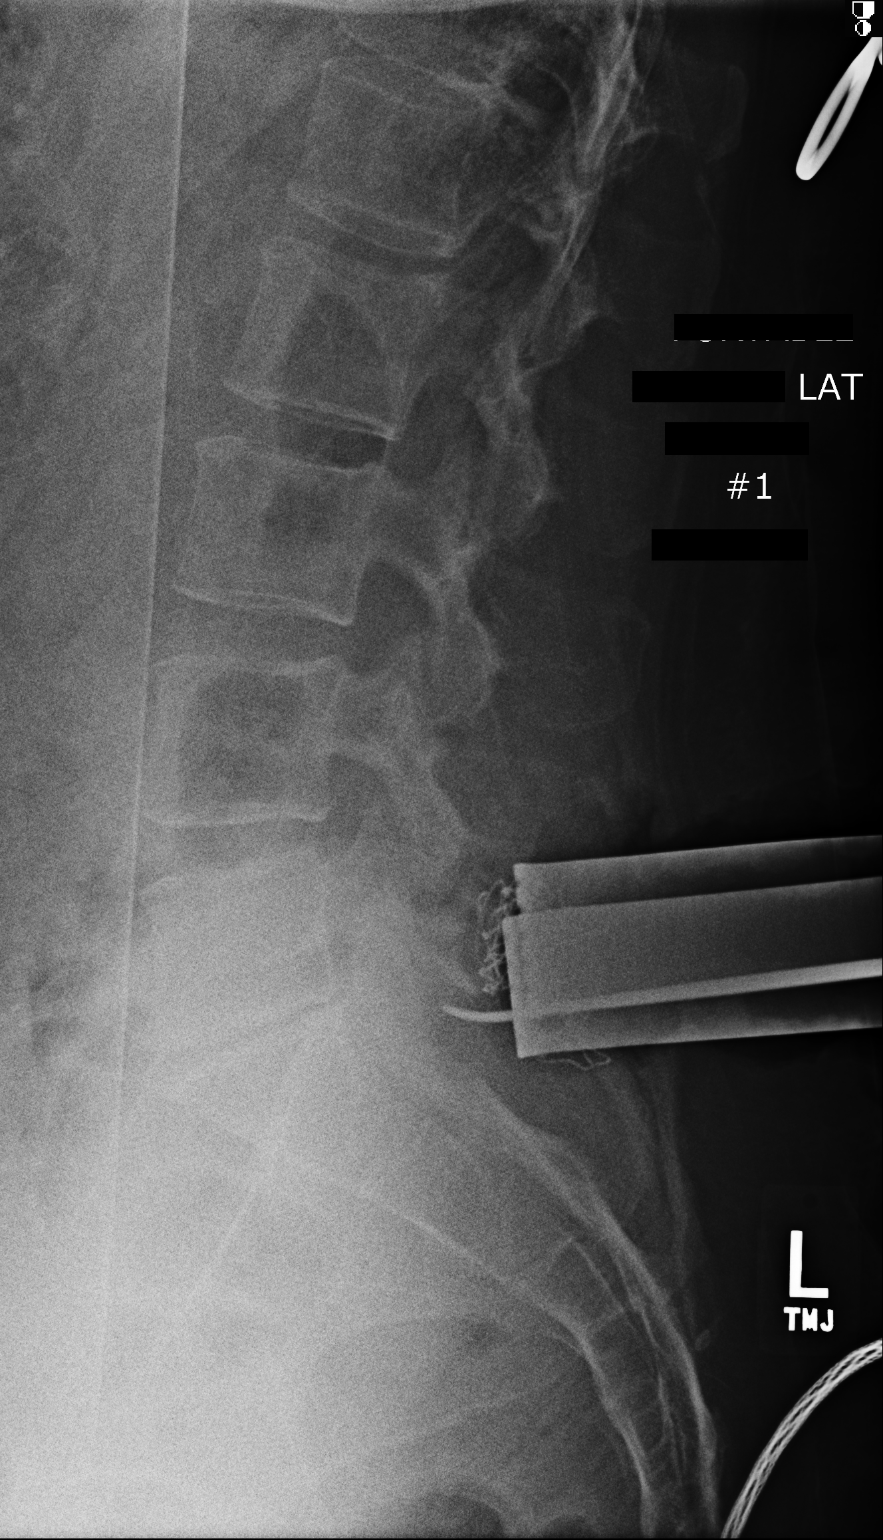

[1 of 1 positions shown; findings below may reference images not displayed]

FINDINGS: Single cross-table lateral view of the lumbar spine was obtained.
There is surgical marking along the posterior aspect of L5-S1. Again
noted is marked disc space loss at L5-S1.
IMPRESSION: Surgical marking at L5-S1.

## 2016-05-11 IMAGING — RF DG LUMBAR SPINE 2-3V
1 series · 2 of 2 positions shown · IV contrast (agent unspecified)
Comparison: 03/23/2014 intraoperative images

CLINICAL DATA: L5-S1 fusion

EXAM:
DG C-ARM 61-120 MIN; LUMBAR SPINE - 2-3 VIEW
TECHNIQUE: Two digital C-arm fluoroscopic images obtained intraoperatively are
submitted
CONTRAST:  None utilized
FLUOROSCOPY TIME:  Radiation Exposure Index (as provided by the
fluoroscopic device): Not provided
If the device does not provide the exposure index:
Fluoroscopy Time (in minutes and seconds):  0 minutes 30 seconds
Number of Acquired Images:  2

[Series 1: run · 2 of 2 slices shown]
[im 1/2]
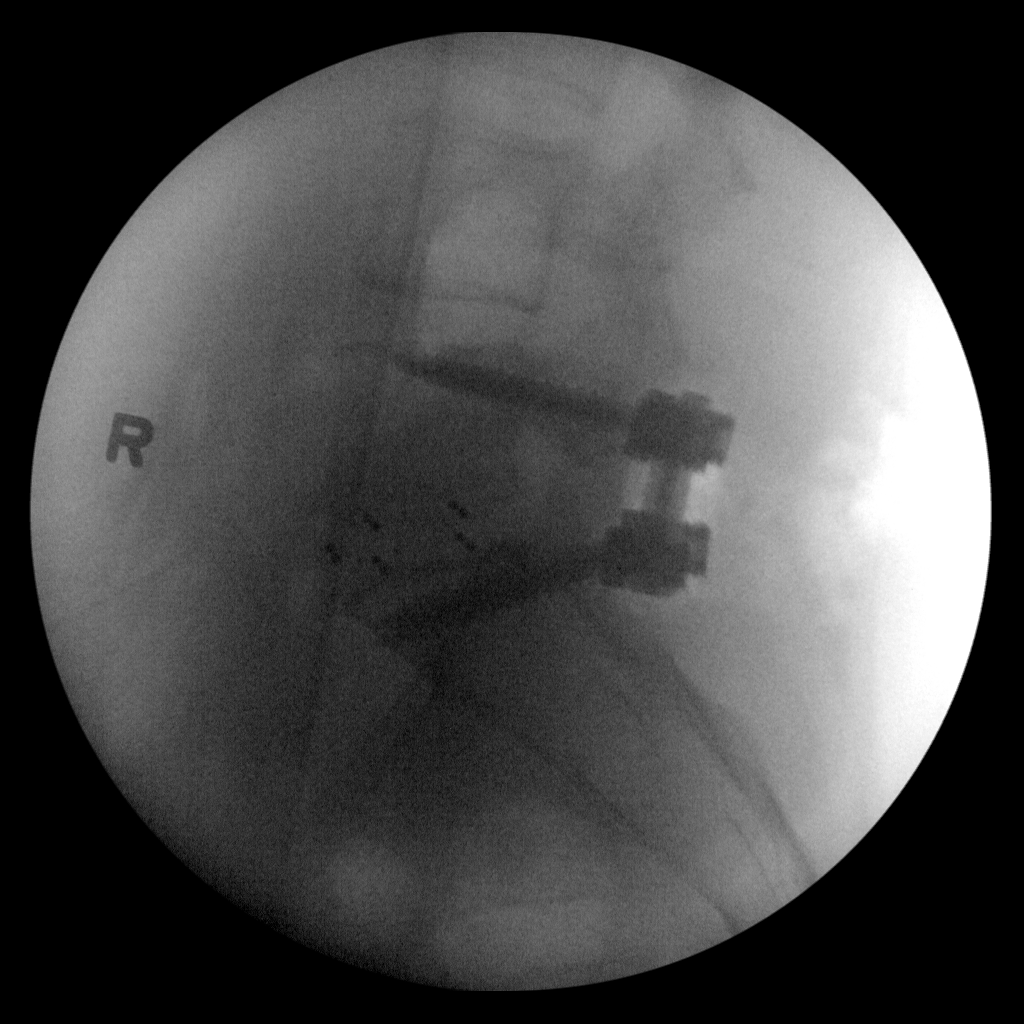
[im 2/2]
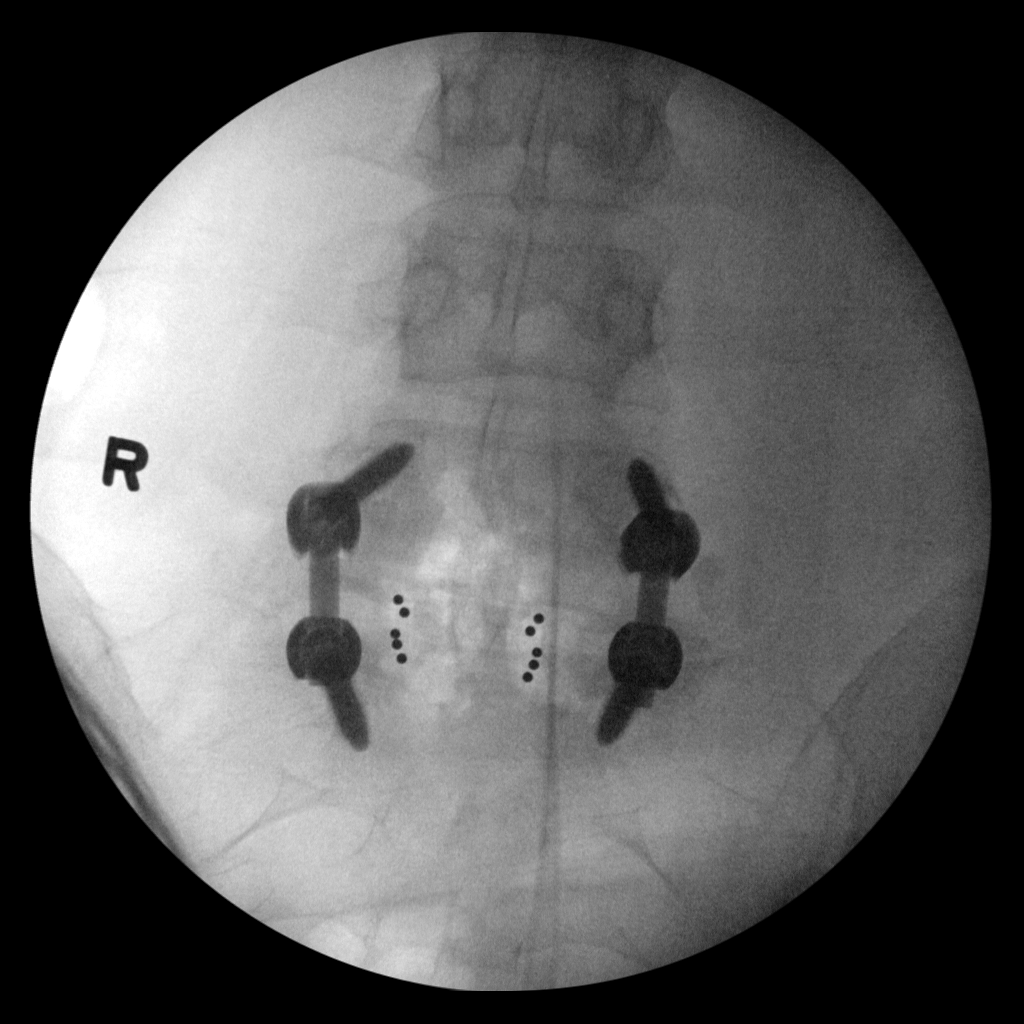

[2 of 2 positions shown; findings below may reference images not displayed]

FINDINGS: Preceding exam labeled with 5 lumbar vertebrae, current exam labeled
accordingly.

BILATERAL pedicle screws and bars are identified at L5-S1 post
posterior fusion.

Disc prosthesis present at L5-S1.

Bones demineralized.

No fracture or bone destruction.
IMPRESSION: Posterior fusion L5-S1.

## 2016-12-26 ENCOUNTER — Other Ambulatory Visit: Payer: Self-pay | Admitting: Obstetrics and Gynecology

## 2016-12-27 NOTE — Patient Instructions (Signed)
Your procedure is scheduled on:  Thursday, Dec. 27, 2018  [facility] at:  6:30 AM  Pick up the phone at the desk and dial [phone].  Call this number if you have problems the morning of surgery: [phone].  Remember: Do NOT eat food or drink after:  Midnight Wednesday  Take these medicines the morning of surgery with a SIP OF WATER:  None  Stop ALL herbal medications at this time  Do NOT smoke the day of surgery.  Do NOT wear jewelry (body piercing), metal hair clips/bobby pins, make-up, artifical eyelashes or nail polish. Do NOT wear lotions, powders, or perfumes.  You may wear deodorant. Do NOT shave for 48 hours prior to surgery. Do NOT bring valuables to the hospital. Contacts, dentures, or bridgework may not be worn into surgery.  Have a responsible adult drive you home and stay with you for 24 hours after your procedure  Bring a copy of your healthcare power of attorney and living will documents.  [facility] - Preparing for Surgery Before surgery, you can play an important role.  Because skin is not sterile, your skin needs to be as free of germs as possible.  You can reduce the number of germs on your skin by washing with CHG (chlorahexidine gluconate) soap before surgery.  CHG is an antiseptic cleaner which kills germs and bonds with the skin to continue killing germs even after washing. Please DO NOT use if you have an allergy to CHG or antibacterial soaps.  If your skin becomes reddened/irritated stop using the CHG and inform your nurse when you arrive at Short Stay. Do not shave (including legs and underarms) for at least 48 hours prior to the first CHG shower.  You may shave your face/neck.  Please follow these instructions carefully:  1.  Shower with CHG Soap the night before surgery and the  morning of surgery.  2.  If you choose to wash your hair, wash your hair first as usual with your normal  shampoo.  3.  After you  shampoo, rinse your hair and body thoroughly to remove the shampoo.                             4.  Use CHG as you would any other liquid soap.  You can apply chg directly to the skin and wash.  Gently with a scrungie or clean washcloth.  5.  Apply the CHG Soap to your body ONLY FROM THE NECK DOWN.   Do   not use on face/ open                           Wound or open sores. Avoid contact with eyes, ears mouth and   genitals (private parts).                       Wash face,  Genitals (private parts) with your normal soap.             6.  Wash thoroughly, paying special attention to the area where your    surgery  will be performed.  7.  Thoroughly rinse your body with warm water from the neck down.  8.  DO NOT shower/wash with your normal soap after using and rinsing off the CHG Soap.  9.  Pat yourself dry with a clean towel.            10.  Wear clean pajamas.            11.  Place clean sheets on your bed the night of your first shower and do not  sleep with pets. Day of Surgery : Do not apply any lotions/deodorants the morning of surgery.  Please wear clean clothes to the hospital/surgery center.  FAILURE TO FOLLOW THESE INSTRUCTIONS MAY RESULT IN THE CANCELLATION OF YOUR SURGERY  PATIENT SIGNATURE_________________________________  NURSE SIGNATURE__________________________________  ________________________________________________________________________

## 2017-01-03 ENCOUNTER — Encounter (HOSPITAL_COMMUNITY): Payer: Self-pay

## 2017-01-03 ENCOUNTER — Other Ambulatory Visit: Payer: Self-pay

## 2017-01-03 ENCOUNTER — Encounter (HOSPITAL_COMMUNITY)
Admission: RE | Admit: 2017-01-03 | Discharge: 2017-01-03 | Disposition: A | Discharge status: Home / Self Care | Payer: BLUE CROSS/BLUE SHIELD | Source: Ambulatory Visit | Attending: Obstetrics and Gynecology | Admitting: Obstetrics and Gynecology

## 2017-01-03 DIAGNOSIS — Z01810 Encounter for preprocedural cardiovascular examination: Secondary | ICD-10-CM | POA: Insufficient documentation

## 2017-01-03 DIAGNOSIS — Z01818 Encounter for other preprocedural examination: Secondary | ICD-10-CM | POA: Insufficient documentation

## 2017-01-03 HISTORY — DX: Anxiety disorder, unspecified: F41.9

## 2017-01-03 HISTORY — DX: Bipolar disorder, unspecified: F31.9

## 2017-01-03 HISTORY — DX: Unspecified asthma, uncomplicated: J45.909

## 2017-01-03 HISTORY — DX: Other chronic pain: G89.29

## 2017-01-03 HISTORY — DX: Obesity, unspecified: E66.9

## 2017-01-03 HISTORY — DX: Other allergic rhinitis: J30.89

## 2017-01-03 HISTORY — DX: Dorsalgia, unspecified: M54.9

## 2017-01-03 LAB — CBC
HCT: 40.2 % (ref 36.0–46.0)
Hemoglobin: 13.2 g/dL (ref 12.0–15.0)
MCH: 32.5 pg (ref 26.0–34.0)
MCHC: 32.8 g/dL (ref 30.0–36.0)
MCV: 99 fL (ref 78.0–100.0)
Platelets: 296 10*3/uL (ref 150–400)
RBC: 4.06 MIL/uL (ref 3.87–5.11)
RDW: 13.5 % (ref 11.5–15.5)
WBC: 8.1 10*3/uL (ref 4.0–10.5)

## 2017-01-03 LAB — BASIC METABOLIC PANEL
Anion gap: 13 (ref 5–15)
BUN: 11 mg/dL (ref 6–20)
CO2: 21 mmol/L — ABNORMAL LOW (ref 22–32)
Calcium: 8.7 mg/dL — ABNORMAL LOW (ref 8.9–10.3)
Chloride: 105 mmol/L (ref 101–111)
Creatinine, Ser: 0.86 mg/dL (ref 0.44–1.00)
GFR calc Af Amer: >60 mL/min (ref >60)
GFR calc non Af Amer: >60 mL/min (ref >60)
Glucose, Bld: 86 mg/dL (ref 65–99)
Potassium: 4.6 mmol/L (ref 3.5–5.1)
Sodium: 139 mmol/L (ref 135–145)

## 2017-01-03 NOTE — Patient Instructions (Addendum)
Your procedure is scheduled on:  Thursday, Dec 27  [facility] at: 6:30 am  Pick up the phone at the desk and dial [phone].  Call this number if you have problems the morning of surgery: [phone].  Remember: Do NOT eat food or Do NOT drink clear liquids (including water) after midnight Wednesday  Take these medicines the morning of surgery with a SIP OF WATER:  Amlodipine and xanax if needed.  Do not take Wednesday night or Thursday morning (day of surgery) dose of Metformin.   Bring Albuterol inhaler with you on day of surgery.  Stop herbal medications and supplements at this time.  Do NOT wear jewelry (body piercing), metal hair clips/bobby pins, make-up, or nail polish. Do NOT wear lotions, powders, or perfumes.  You may wear deoderant. Do NOT shave for 48 hours prior to surgery. Do NOT bring valuables to the hospital. Contacts may not be worn into surgery.  Have a responsible adult drive you home and stay with you for 24 hours after your procedure.  Home with Husband Rosanne AshingJim cell [phone].

## 2017-01-04 ENCOUNTER — Encounter (HOSPITAL_COMMUNITY)
Admission: RE | Admit: 2017-01-04 | Discharge: 2017-01-04 | Disposition: A | Discharge status: Home / Self Care | Payer: BLUE CROSS/BLUE SHIELD | Source: Ambulatory Visit | Attending: Obstetrics and Gynecology | Admitting: Obstetrics and Gynecology

## 2017-01-10 NOTE — Anesthesia Preprocedure Evaluation (Addendum)
Anesthesia Evaluation  Patient identified by MRN, date of birth, ID band Patient awake    Reviewed: Allergy & Precautions, H&P , NPO status , Patient's Chart, lab work & pertinent test results, reviewed documented beta blocker date and time   Airway Mallampati: II  TM Distance: >3 FB Neck ROM: full    Dental no notable dental hx. (+) Teeth Intact, Dental Advisory Given, Caps,    Pulmonary asthma ,    Pulmonary exam normal breath sounds clear to auscultation       Cardiovascular hypertension, Pt. on medications Normal cardiovascular exam Rhythm:regular Rate:Normal     Neuro/Psych  Headaches, Seizures -, Well Controlled,  PSYCHIATRIC DISORDERS Anxiety Depression Bipolar Disorder    GI/Hepatic negative GI ROS, Neg liver ROS,   Endo/Other  Morbid obesity  Renal/GU negative Renal ROS     Musculoskeletal  (+) Arthritis ,   Abdominal   Peds  Hematology negative hematology ROS (+)   Anesthesia Other Findings Day of surgery medications reviewed with the patient.  Reproductive/Obstetrics                           Anesthesia Physical Anesthesia Plan  ASA: III  Anesthesia Plan: General   Post-op Pain Management:    Induction: Intravenous  PONV Risk Score and Plan: 4 or greater and Dexamethasone, Treatment may vary due to age or medical condition, Ondansetron, Scopolamine patch - Pre-op and Midazolam  Airway Management Planned: LMA  Additional Equipment:   Intra-op Plan:   Post-operative Plan:   Informed Consent: I have reviewed the patients History and Physical, chart, labs and discussed the procedure including the risks, benefits and alternatives for the proposed anesthesia with the patient or authorized representative who has indicated his/her understanding and acceptance.   Dental Advisory Given  Plan Discussed with: CRNA and Surgeon  Anesthesia Plan Comments: ( )      Anesthesia  Quick Evaluation

## 2017-01-10 NOTE — H&P (Signed)
Kelli FetterChristine E Barnett is an 41 y.o. female. History of menorrhagia for [name] , D&C and Novasure.   Pertinent Gynecological History: Menses: flow is excessive with use of many pads or tampons on heaviest days Bleeding: menorrhagia Contraception: none DES exposure: denies Blood transfusions: none Sexually transmitted diseases: no past history Previous GYN Procedures: DNC  Last mammogram: normal Date: 2018 Last pap: normal Date: 2018 OB History: G2, P2   Menstrual History: Menarche age: [age] No LMP recorded.    Past Medical History:  Diagnosis Date  . Anemia    hx  . Anxiety   . Asthma    rarely uses inhaler - uses inhaler when she gets sick  . Bipolar disorder (HCC)   . Chronic back pain   . Depression    no meds few yrs  . Environmental and seasonal allergies    tx with weekly allergy injections - Thursday  . Headache    last one 2 months ago  . Heart murmur    child echo done as child  . Lumbar vertebral fracture (HCC)   . Obesity    tx with metformin  . Seizures (HCC)    hx childhood epilepsy- last seizure 41 yrs old, no problems as adult, no meds  . SVD (spontaneous vaginal delivery)    x 2    Past Surgical History:  Procedure Laterality Date  . BACK SURGERY  2015   L5-S1 fusion  . CHOLECYSTECTOMY    . COLONOSCOPY     polyps  . GANGLION CYST EXCISION Right    wrist  . GASTRIC BYPASS  2009  . ULNAR NERVE TRANSPOSITION Left 03  . UPPER GI ENDOSCOPY    . WISDOM TOOTH EXTRACTION      No family history on file.  Social History:  reports that  has never smoked. she has never used smokeless tobacco. She reports that she drinks alcohol. She reports that she does not use drugs.  Allergies:  Allergies  Allergen Reactions  . Tussionex Pennkinetic Er [Hydrocod Polst-Cpm Polst Er] Other (See Comments)    Caused nightmres  . Naproxen Nausea And Vomiting    No medications prior to admission.    Review of Systems  Constitutional: Negative.   All other  systems reviewed and are negative.   There were no vitals taken for this visit. Physical Exam  Nursing note and vitals reviewed. Constitutional: She is oriented to person, place, and time. She appears well-developed and well-nourished.  HENT:  Head: Normocephalic and atraumatic.  Neck: Normal range of motion. Neck supple.  Cardiovascular: Normal rate and regular rhythm.  Respiratory: Effort normal and breath sounds normal.  GI: Bowel sounds are normal.  Genitourinary: Vagina normal and uterus normal.  Musculoskeletal: Normal range of motion.  Neurological: She is alert and oriented to person, place, and time. She has normal reflexes.  Skin: Skin is warm and dry.  Psychiatric: She has a normal mood and affect.    No results found for this or any previous visit (from the past 24 hour(s)).  No results found.  Assessment/Plan: Refractory Menorrhagia [name], D&C, Novasure Consent done.  [name] 01/10/2017, 9:30 PM

## 2017-01-11 ENCOUNTER — Ambulatory Visit (HOSPITAL_COMMUNITY): Payer: BLUE CROSS/BLUE SHIELD | Admitting: Anesthesiology

## 2017-01-11 ENCOUNTER — Encounter (HOSPITAL_COMMUNITY): Payer: Self-pay

## 2017-01-11 ENCOUNTER — Ambulatory Visit (HOSPITAL_COMMUNITY)
Admission: RE | Admit: 2017-01-11 | Discharge: 2017-01-11 | Disposition: A | Discharge status: Home / Self Care | Payer: BLUE CROSS/BLUE SHIELD | Source: Ambulatory Visit | Attending: Obstetrics and Gynecology | Admitting: Obstetrics and Gynecology

## 2017-01-11 ENCOUNTER — Encounter (HOSPITAL_COMMUNITY)
Admission: RE | Disposition: A | Discharge status: Home / Self Care | Payer: Self-pay | Source: Ambulatory Visit | Attending: Obstetrics and Gynecology

## 2017-01-11 DIAGNOSIS — Z888 Allergy status to other drugs, medicaments and biological substances status: Secondary | ICD-10-CM | POA: Insufficient documentation

## 2017-01-11 DIAGNOSIS — G8929 Other chronic pain: Secondary | ICD-10-CM | POA: Diagnosis not present

## 2017-01-11 DIAGNOSIS — N920 Excessive and frequent menstruation with regular cycle: Secondary | ICD-10-CM | POA: Diagnosis present

## 2017-01-11 DIAGNOSIS — M1990 Unspecified osteoarthritis, unspecified site: Secondary | ICD-10-CM | POA: Insufficient documentation

## 2017-01-11 DIAGNOSIS — F419 Anxiety disorder, unspecified: Secondary | ICD-10-CM | POA: Diagnosis not present

## 2017-01-11 DIAGNOSIS — I10 Essential (primary) hypertension: Secondary | ICD-10-CM | POA: Insufficient documentation

## 2017-01-11 HISTORY — PX: DILITATION & CURRETTAGE/HYSTROSCOPY WITH NOVASURE ABLATION: SHX5568

## 2017-01-11 LAB — HCG, SERUM, QUALITATIVE: Preg, Serum: NEGATIVE

## 2017-01-11 SURGERY — DILATATION & CURETTAGE/HYSTEROSCOPY WITH NOVASURE ABLATION
Anesthesia: General

## 2017-01-11 MED ORDER — ACETAMINOPHEN 500 MG PO TABS
ORAL_TABLET | ORAL | Status: AC
  Administered 2017-01-11: 1000 mg via ORAL
  Filled 2017-01-11: qty 2

## 2017-01-11 MED ORDER — SCOPOLAMINE 1 MG/3DAYS TD PT72
MEDICATED_PATCH | TRANSDERMAL | Status: AC
  Administered 2017-01-11: 1.5 mg via TRANSDERMAL
  Filled 2017-01-11: qty 1

## 2017-01-11 MED ORDER — MIDAZOLAM HCL 2 MG/2ML IJ SOLN
INTRAMUSCULAR | Status: DC | PRN
  Administered 2017-01-11: 2 mg via INTRAVENOUS

## 2017-01-11 MED ORDER — FENTANYL CITRATE (PF) 100 MCG/2ML IJ SOLN
25.0000 ug | INTRAMUSCULAR | Status: DC | PRN
  Administered 2017-01-11 (×3): 50 ug via INTRAVENOUS

## 2017-01-11 MED ORDER — ACETAMINOPHEN 500 MG PO TABS
1000.0000 mg | ORAL_TABLET | Freq: Once | ORAL | Status: AC
  Administered 2017-01-11: 1000 mg via ORAL

## 2017-01-11 MED ORDER — MIDAZOLAM HCL 2 MG/2ML IJ SOLN
INTRAMUSCULAR | Status: AC
  Filled 2017-01-11: qty 2

## 2017-01-11 MED ORDER — FENTANYL CITRATE (PF) 100 MCG/2ML IJ SOLN
INTRAMUSCULAR | Status: AC
  Administered 2017-01-11: 50 ug via INTRAVENOUS
  Filled 2017-01-11: qty 2

## 2017-01-11 MED ORDER — PROPOFOL 10 MG/ML IV BOLUS
INTRAVENOUS | Status: AC
  Filled 2017-01-11: qty 20

## 2017-01-11 MED ORDER — ONDANSETRON HCL 4 MG/2ML IJ SOLN
INTRAMUSCULAR | Status: AC
  Filled 2017-01-11: qty 2

## 2017-01-11 MED ORDER — FENTANYL CITRATE (PF) 100 MCG/2ML IJ SOLN
INTRAMUSCULAR | Status: AC
  Filled 2017-01-11: qty 2

## 2017-01-11 MED ORDER — KETOROLAC TROMETHAMINE 30 MG/ML IJ SOLN
30.0000 mg | Freq: Once | INTRAMUSCULAR | Status: AC
  Administered 2017-01-11: 30 mg via INTRAVENOUS

## 2017-01-11 MED ORDER — LACTATED RINGERS IV SOLN
INTRAVENOUS | Status: DC
  Administered 2017-01-11: 125 mL/h via INTRAVENOUS

## 2017-01-11 MED ORDER — FENTANYL CITRATE (PF) 100 MCG/2ML IJ SOLN
25.0000 ug | INTRAMUSCULAR | Status: DC | PRN
  Administered 2017-01-11 (×2): 50 ug via INTRAVENOUS

## 2017-01-11 MED ORDER — KETOROLAC TROMETHAMINE 30 MG/ML IJ SOLN
INTRAMUSCULAR | Status: AC
  Filled 2017-01-11: qty 1

## 2017-01-11 MED ORDER — PROPOFOL 10 MG/ML IV BOLUS
INTRAVENOUS | Status: DC | PRN
  Administered 2017-01-11: 250 mg via INTRAVENOUS

## 2017-01-11 MED ORDER — BUPIVACAINE HCL (PF) 0.25 % IJ SOLN
INTRAMUSCULAR | Status: DC | PRN
  Administered 2017-01-11: 20 mL

## 2017-01-11 MED ORDER — OXYCODONE HCL 5 MG PO TABS: 5.0000 mg | ORAL_TABLET | Freq: Four times a day (QID) | ORAL | 0 refills | Status: DC | PRN

## 2017-01-11 MED ORDER — DEXAMETHASONE SODIUM PHOSPHATE 4 MG/ML IJ SOLN
INTRAMUSCULAR | Status: AC
  Filled 2017-01-11: qty 1

## 2017-01-11 MED ORDER — FENTANYL CITRATE (PF) 100 MCG/2ML IJ SOLN
INTRAMUSCULAR | Status: DC | PRN
  Administered 2017-01-11: 25 ug via INTRAVENOUS
  Administered 2017-01-11: 75 ug via INTRAVENOUS

## 2017-01-11 MED ORDER — LIDOCAINE HCL (CARDIAC) 20 MG/ML IV SOLN
INTRAVENOUS | Status: DC | PRN
  Administered 2017-01-11: 100 mg via INTRAVENOUS

## 2017-01-11 MED ORDER — ONDANSETRON HCL 4 MG/2ML IJ SOLN
INTRAMUSCULAR | Status: DC | PRN
  Administered 2017-01-11: 4 mg via INTRAVENOUS

## 2017-01-11 MED ORDER — SCOPOLAMINE 1 MG/3DAYS TD PT72
1.0000 | MEDICATED_PATCH | Freq: Once | TRANSDERMAL | Status: DC
  Administered 2017-01-11: 1.5 mg via TRANSDERMAL

## 2017-01-11 MED ORDER — DEXAMETHASONE SODIUM PHOSPHATE 10 MG/ML IJ SOLN
INTRAMUSCULAR | Status: DC | PRN
  Administered 2017-01-11: 4 mg via INTRAVENOUS

## 2017-01-11 MED ORDER — DEXTROSE 5 % IV SOLN
INTRAVENOUS | Status: AC
  Filled 2017-01-11: qty 3000

## 2017-01-11 MED ORDER — PROMETHAZINE HCL 25 MG/ML IJ SOLN: 6.2500 mg | INTRAMUSCULAR | Status: DC | PRN

## 2017-01-11 MED ORDER — DEXTROSE 5 % IV SOLN
3.0000 g | INTRAVENOUS | Status: AC
  Administered 2017-01-11: 3 g via INTRAVENOUS
  Filled 2017-01-11: qty 3000

## 2017-01-11 MED ORDER — LACTATED RINGERS IR SOLN
Status: DC | PRN
  Administered 2017-01-11: 372 mL

## 2017-01-11 MED ORDER — LIDOCAINE HCL (CARDIAC) 20 MG/ML IV SOLN
INTRAVENOUS | Status: AC
  Filled 2017-01-11: qty 5

## 2017-01-11 SURGICAL SUPPLY — 15 items
ABLATOR ENDOMETRIAL BIPOLAR (ABLATOR) ×2 IMPLANT
CANISTER SUCT 3000ML PPV (MISCELLANEOUS) ×2 IMPLANT
CATH ROBINSON RED A/P 16FR (CATHETERS) ×2 IMPLANT
CONTAINER PREFILL 10% NBF 60ML (FORM) ×3 IMPLANT
GLOVE BIO SURGEON STRL SZ7.5 (GLOVE) ×2 IMPLANT
GLOVE BIOGEL PI IND STRL 7.0 (GLOVE) ×1 IMPLANT
GLOVE BIOGEL PI INDICATOR 7.0 (GLOVE) ×1
GOWN STRL REUS W/TWL LRG LVL3 (GOWN DISPOSABLE) ×4 IMPLANT
PACK VAGINAL MINOR WOMEN LF (CUSTOM PROCEDURE TRAY) ×2 IMPLANT
PAD OB MATERNITY 4.3X12.25 (PERSONAL CARE ITEMS) ×2 IMPLANT
PAD PREP 24X48 CUFFED NSTRL (MISCELLANEOUS) ×2 IMPLANT
SYR TB 1ML 25GX5/8 (SYRINGE) ×2 IMPLANT
TOWEL OR 17X24 6PK STRL BLUE (TOWEL DISPOSABLE) ×4 IMPLANT
TUBING AQUILEX INFLOW (TUBING) ×2 IMPLANT
TUBING AQUILEX OUTFLOW (TUBING) ×2 IMPLANT

## 2017-01-11 NOTE — Anesthesia Postprocedure Evaluation (Signed)
Anesthesia Post Note  Patient: Kelli FetterChristine E Barnett  Procedure(s) Performed: DILATATION & CURETTAGE/HYSTEROSCOPY WITH NOVASURE ABLATION (N/A )     Patient location during evaluation: PACU Anesthesia Type: General Level of consciousness: awake and alert Pain management: pain level controlled Vital Signs Assessment: post-procedure vital signs reviewed and stable Respiratory status: spontaneous breathing, nonlabored ventilation, respiratory function stable and patient connected to nasal cannula oxygen Cardiovascular status: blood pressure returned to baseline and stable Postop Assessment: no apparent nausea or vomiting Anesthetic complications: no    Last Vitals:  Vitals:   01/11/17 1015 01/11/17 1030  BP: 121/83 112/84  Pulse: 69 73  Resp: 13 14  Temp:    SpO2: 96% 95%    Last Pain:  Vitals:   01/11/17 1015  TempSrc:   PainSc: 4    Pain Goal: Patients Stated Pain Goal: 2 (01/11/17 16100643)               Jiles GarterJACKSON,[name]

## 2017-01-11 NOTE — Op Note (Signed)
01/11/2017  8:30 AM  PATIENT:  Kelli Barnett  41 y.o. female  PRE-OPERATIVE DIAGNOSIS:  Menorrhagia  POST-OPERATIVE DIAGNOSIS:  Menorrhagia  PROCEDURE:  Procedure(s): DILATATION & CURETTAGE/HYSTEROSCOPY WITH NOVASURE ABLATION  SURGEON:  Surgeon(s): Olivia Mackieaavon, Laportia Carley, MD  ASSISTANTS: none   ANESTHESIA:   local and general  ESTIMATED BLOOD LOSS: MINIMAL   DRAINS: none   LOCAL MEDICATIONS USED:  MARCAINE    and Amount: 20 ml  SPECIMEN:  Source of Specimen:  [facility]  DISPOSITION OF SPECIMEN:  PATHOLOGY  COUNTS:  YES  DICTATION #: [id number]  PLAN OF CARE: DC HOME  PATIENT DISPOSITION:  PACU - hemodynamically stable.

## 2017-01-11 NOTE — Transfer of Care (Signed)
Immediate Anesthesia Transfer of Care Note  Patient: Kelli FetterChristine E Barnett  Procedure(s) Performed: DILATATION & CURETTAGE/HYSTEROSCOPY WITH NOVASURE ABLATION (N/A )  Patient Location: PACU  Anesthesia Type:General  Level of Consciousness: awake, alert  and oriented  Airway & Oxygen Therapy: Patient Spontanous Breathing and Patient connected to nasal cannula oxygen  Post-op Assessment: Report given to RN, Post -op Vital signs reviewed and stable and Patient moving all extremities  Post vital signs: Reviewed and stable  Last Vitals:  Vitals:   01/11/17 0643  BP: (!) 111/91  Pulse: 79  Resp: 16  Temp: 36.5 C  SpO2: 98%    Last Pain:  Vitals:   01/11/17 0643  TempSrc: Oral  PainSc: 3       Patients Stated Pain Goal: 2 (01/11/17 [id number])  Complications: No apparent anesthesia complications

## 2017-01-11 NOTE — Discharge Instructions (Signed)
  Post Anesthesia Home Care Instructions  Activity: Get plenty of rest for the remainder of the day. A responsible individual must stay with you for 24 hours following the procedure.  For the next 24 hours, DO NOT: -Drive a car -Operate machinery -Drink alcoholic beverages -Take any medication unless instructed by your physician -Make any legal decisions or sign important papers.  Meals: Start with liquid foods such as gelatin or soup. Progress to regular foods as tolerated. Avoid greasy, spicy, heavy foods. If nausea and/or vomiting occur, drink only clear liquids until the nausea and/or vomiting subsides. Call your physician if vomiting continues.  Special Instructions/Symptoms: Your throat may feel dry or sore from the anesthesia or the breathing tube placed in your throat during surgery. If this causes discomfort, gargle with warm salt water. The discomfort should disappear within 24 hours.  If you had a scopolamine patch placed behind your ear for the management of post- operative nausea and/or vomiting:  1. The medication in the patch is effective for 72 hours, after which it should be removed.  Wrap patch in a tissue and discard in the trash. Wash hands thoroughly with soap and water. 2. You may remove the patch earlier than 72 hours if you experience unpleasant side effects which may include dry mouth, dizziness or visual disturbances. 3. Avoid touching the patch. Wash your hands with soap and water after contact with the patch.        DISCHARGE INSTRUCTIONS: D&C / D&E The following instructions have been prepared to help you care for yourself upon your return home.   Personal hygiene:  Use sanitary pads for vaginal drainage, not tampons.  Shower the day after your procedure.  NO tub baths, pools or Jacuzzis for 2-3 weeks.  Wipe front to back after using the bathroom.  Activity and limitations:  Do NOT drive or operate any equipment for 24 hours. The effects of anesthesia  are still present and drowsiness may result.  Do NOT rest in bed all day.  Walking is encouraged.  Walk up and down stairs slowly.  You may resume your normal activity in one to two days or as indicated by your physician.  Sexual activity: NO intercourse for at least 2 weeks after the procedure, or as indicated by your physician.  Diet: Eat a light meal as desired this evening. You may resume your usual diet tomorrow.  Return to work: You may resume your work activities in one to two days or as indicated by your doctor.  What to expect after your surgery: Expect to have vaginal bleeding/discharge for 2-3 days and spotting for up to 10 days. It is not unusual to have soreness for up to 1-2 weeks. You may have a slight burning sensation when you urinate for the first day. Mild cramps may continue for a couple of days. You may have a regular period in 2-6 weeks.  Call your doctor for any of the following:  Excessive vaginal bleeding, saturating and changing one pad every hour.  Inability to urinate 6 hours after discharge from hospital.  Pain not relieved by pain medication.  Fever of 100.4 F or greater.  Unusual vaginal discharge or odor.       

## 2017-01-11 NOTE — Op Note (Signed)
NAMServando Salina:  Lansky, Classie             ACCOUNT NO.:  [id number]  MEDICAL RECORD NO.:  [id number]  LOCATION:                                 FACILITY:  PHYSICIAN:  Lenoard Adenichard J. Bejamin Hackbart, M.D.     DATE OF BIRTH:  DATE OF PROCEDURE: DATE OF DISCHARGE:                              OPERATIVE REPORT   PREOPERATIVE DIAGNOSIS:  Refractory menorrhagia.  POSTOPERATIVE DIAGNOSIS:  Refractory menorrhagia.  PROCEDURES:  Diagnostic hysteroscopy, D and C, NovaSure endometrial ablation.  SURGEON:  Lenoard Adenichard J. Emanuel Dowson, M.D.  ASSISTANT:  None.  ANESTHESIA:  General, local.  ESTIMATED BLOOD LOSS:  Less than 50 mL.  FLUID DEFICIT:  15 mL.  COMPLICATIONS:  None.  DRAINS:  None.  COUNTS:  Correct.  The patient to recovery in good condition.  SPECIMEN:  Endometrial curettings to Pathology.  BRIEF OPERATIVE NOTE:  After being apprised of risks of anesthesia, infection, bleeding, injury to surrounding organs with possible uterine perforation with need for repair, the patient was brought to the operating room, where she was administered general anesthetic without complications.  Prepped and draped in usual sterile fashion. Catheterized until the bladder was empty.  Exam under anesthesia reveals a mid positioned, bulky uterus, and no adnexal masses.  Cervix was easily dilated to 23 Pratt dilator.  Hysteroscope placed.  Visualization reveals normal endometrial cavity.  Bilateral normal tubal ostia. NovaSure device was placed, seated to a length of 6.5, a width of 3.5, initiated after negative CO2 test for approximately 49 seconds without difficulty.  Device was removed, inspected, found to be intact. Revisualization of endometrial cavity reveals a well-ablated endometrial cavity without evidence of perforation.  Good hemostasis noted.  Fluid deficit as noted.  The patient tolerated the procedure well and was transferred to recovery in good condition.  Please note at the beginning of the procedure,  dilute Marcaine solution was used to place an uncomplicated standard paracervical block.     Lenoard Adenichard J. Alahna Dunne, M.D.     RJT/MEDQ  D:  01/11/2017  T:  01/11/2017  Job:  [id number]

## 2017-01-11 NOTE — Anesthesia Procedure Notes (Signed)
Procedure Name: LMA Insertion Date/Time: 01/11/2017 8:06 AM Performed by: Elgie CongoMalinova, Raylee Adamec H, CRNA Pre-anesthesia Checklist: Patient identified, Emergency Drugs available, Suction available and Patient being monitored Patient Re-evaluated:Patient Re-evaluated prior to induction Oxygen Delivery Method: Circle system utilized Preoxygenation: Pre-oxygenation with 100% oxygen Induction Type: IV induction LMA: LMA inserted LMA Size: 4.0 Number of attempts: 1 Placement Confirmation: positive ETCO2 and breath sounds checked- equal and bilateral Tube secured with: Tape Dental Injury: Teeth and Oropharynx as per pre-operative assessment

## 2017-01-11 NOTE — Progress Notes (Signed)
Patient ID: Kelli FetterChristine E Barnett, female   DOB: 1975/11/09, 41 y.o.   MRN: [id number] Patient seen and examined. Consent witnessed and signed. No changes noted. Update completed.

## 2017-01-12 ENCOUNTER — Encounter (HOSPITAL_COMMUNITY): Payer: Self-pay | Admitting: Obstetrics and Gynecology

## 2017-02-08 ENCOUNTER — Ambulatory Visit: Payer: BLUE CROSS/BLUE SHIELD | Admitting: Allergy and Immunology

## 2017-02-08 ENCOUNTER — Encounter: Payer: Self-pay | Admitting: Allergy and Immunology

## 2017-02-08 VITALS — BP 100/74 | HR 84 | Temp 97.6°F | Resp 16 | Ht 69.0 in | Wt 294.8 lb

## 2017-02-08 DIAGNOSIS — J3089 Other allergic rhinitis: Secondary | ICD-10-CM | POA: Diagnosis not present

## 2017-02-08 DIAGNOSIS — J4540 Moderate persistent asthma, uncomplicated: Secondary | ICD-10-CM | POA: Diagnosis not present

## 2017-02-08 MED ORDER — MOMETASONE FURO-FORMOTEROL FUM 200-5 MCG/ACT IN AERO: 2.0000 | INHALATION_SPRAY | Freq: Two times a day (BID) | RESPIRATORY_TRACT | 5 refills | Status: DC

## 2017-02-08 MED ORDER — TIOTROPIUM BROMIDE MONOHYDRATE 1.25 MCG/ACT IN AERS: 2.0000 | INHALATION_SPRAY | Freq: Every day | RESPIRATORY_TRACT | 5 refills | Status: DC

## 2017-02-08 MED ORDER — EPINEPHRINE 0.3 MG/0.3ML IJ SOAJ: INTRAMUSCULAR | 3 refills | Status: AC

## 2017-02-08 NOTE — Progress Notes (Signed)
New Patient Note  RE: Kelli FetterChristine E Barnett MRN: [id number] DOB: 07-Mar-1975 Date of Office Visit: 02/08/2017  Referring provider: Arlan OrganManning, Kelli S, MD Primary care provider: Arlan OrganManning, Kelli S, MD  Chief Complaint: Allergic Rhinitis  and Asthma   History of present illness: Kelli BeamChristine Barnett is a 42 y.o. female seen today in consultation requested by Kelli BorerJames Manning, MD.  She reports that over the past 2 years she has been experiencing episodes of dyspnea, coughing, chest tightness, and wheezing.  She was diagnosed with asthma approximately 1 year ago.  Despite compliance with Advair 250/50 g twice daily she still experiencing severe asthma symptoms 2 or 3 times per week and mild asthma symptoms multiple times daily.  She is frequently awakened from sleep with "a lot of wheezing".  Specific triggers for her asthma symptoms include cold weather, exercise, and pollen exposure. Wynona CanesChristine was started on aeroallergen immunotherapy approximately 1 year ago.  She has noted significant nasal symptom reduction while taking the injections and would like to continue.   Assessment and plan: Moderate persistent asthma Currently with suboptimal control.  A severe asthma screening panel has been ordered to help identify phenotype and assess candidacy for a biologic agent.  These labs include: CBC with differential, serum IgE level, Aspergillus fumigatus serum specific IgE level, Aspergillus IgG precipitins panel, alpha-1 antitrypsin, and ANCA titers.  A prescription has been [facility] (mometasone/formoterol) 200/5 g,  2 inhalations twice a day. To maximize pulmonary deposition, a spacer has been provided along with instructions for its proper administration with an HFA inhaler.  A prescription has been provided for Spiriva Respimat 1.25 g, 2 inhalations daily.  Subjective and objective measures of pulmonary function will be followed and the treatment plan will be adjusted accordingly.  Other  allergic rhinitis  The patient will resume aeroallergen immunotherapy under our care using the allergen vaccines and protocol provided by her previous allergist. She has agreed to have access to epinephrine autoinjector.  If needed, add Nasacort AQ and/or fexofenadine (Allegra).  Return for follow-up in 6 months, or sooner if necessary.   Meds ordered this encounter  Medications  . DISCONTD: mometasone-formoterol (DULERA) 200-5 MCG/ACT AERO    Sig: Inhale 2 puffs into the lungs 2 (two) times daily.    Dispense:  1 Inhaler    Refill:  5  . mometasone-formoterol (DULERA) 200-5 MCG/ACT AERO    Sig: Inhale 2 puffs into the lungs 2 (two) times daily. Use with spacer. Rinse, gargle and spit out after use    Dispense:  1 Inhaler    Refill:  5  . Tiotropium Bromide Monohydrate (SPIRIVA RESPIMAT) 1.25 MCG/ACT AERS    Sig: Inhale 2 puffs into the lungs daily.    Dispense:  1 Inhaler    Refill:  5  . EPINEPHrine (AUVI-Q) 0.3 mg/0.3 mL IJ SOAJ injection    Sig: Use as directed for severe allergic reactions    Dispense:  2 Device    Refill:  3    Diagnostics: Spirometry: FVC was 3.71 L and FEV1 was 3.24 L (93% predicted) with 90 mL (3%) postbronchodilator improvement.  This study was performed while the patient was asymptomatic.  Please see scanned spirometry results for details. A   Physical examination: Blood pressure 100/74, pulse 84, temperature 97.6 F (36.4 C), temperature source Oral, resp. rate 16, height 5\' 9"  (1.753 m), weight 294 lb 12.1 oz (133.7 kg), SpO2 97 %.  General: Alert, interactive, in no acute distress. HEENT: TMs pearly gray,  turbinates mildly edematous without discharge, post-pharynx mildly erythematous. Neck: Supple without lymphadenopathy. Lungs: Clear to auscultation without wheezing, rhonchi or rales. CV: Normal S1, S2 without murmurs. Abdomen: Nondistended, nontender. Skin: Warm and dry, without lesions or rashes. Extremities:  No clubbing, cyanosis or  edema. Neuro:   Grossly intact.  Review of systems:  Review of systems negative except as noted in HPI / PMHx or noted below: Review of Systems  Constitutional: Negative.   HENT: Negative.   Eyes: Negative.   Respiratory: Negative.   Cardiovascular: Negative.   Gastrointestinal: Negative.   Genitourinary: Negative.   Musculoskeletal: Negative.   Skin: Negative.   Neurological: Negative.   Endo/Heme/Allergies: Negative.   Psychiatric/Behavioral: Negative.     Past medical history:  Past Medical History:  Diagnosis Date  . Anemia    hx  . Anxiety   . Asthma    rarely uses inhaler - uses inhaler when she gets sick  . Bipolar disorder (HCC)   . Chronic back pain   . Depression    no meds few yrs  . Environmental and seasonal allergies    tx with weekly allergy injections - Thursday  . Headache    last one 2 months ago  . Heart murmur    child echo done as child  . Lumbar vertebral fracture (HCC)   . Migraine   . Obesity    tx with metformin  . Seizures (HCC)    hx childhood epilepsy- last seizure 42 yrs old, no problems as adult, no meds  . SVD (spontaneous vaginal delivery)    x 2    Past surgical history:  Past Surgical History:  Procedure Laterality Date  . BACK SURGERY  2015   L5-S1 fusion  . CHOLECYSTECTOMY    . COLONOSCOPY     polyps  . DILITATION & CURRETTAGE/HYSTROSCOPY WITH NOVASURE ABLATION N/A 01/11/2017   Procedure: DILATATION & CURETTAGE/HYSTEROSCOPY WITH NOVASURE ABLATION;  Surgeon: Olivia Mackie, MD;  Location: [facility];  Service: Gynecology;  Laterality: N/A;  . GANGLION CYST EXCISION Right    wrist  . GASTRIC BYPASS  2009  . ULNAR NERVE TRANSPOSITION Left 03  . UPPER GI ENDOSCOPY    . WISDOM TOOTH EXTRACTION      Family history: Family History  Problem Relation Age of Onset  . Allergic rhinitis Mother   . Asthma Mother   . Allergic rhinitis Sister   . Asthma Sister   . Eczema Sister   . Migraines Sister   . Allergic rhinitis  Paternal Aunt   . Angioedema Neg Hx   . Immunodeficiency Neg Hx   . Urticaria Neg Hx     Social history: Social History   Socioeconomic History  . Marital status: Married    Spouse name: Not on file  . Number of children: Not on file  . Years of education: Not on file  . Highest education level: Not on file  Social Needs  . Financial resource strain: Not on file  . Food insecurity - worry: Not on file  . Food insecurity - inability: Not on file  . Transportation needs - medical: Not on file  . Transportation needs - non-medical: Not on file  Occupational History  . Not on file  Tobacco Use  . Smoking status: Never Smoker  . Smokeless tobacco: Never Used  Substance and Sexual Activity  . Alcohol use: Yes    Comment: occassional  . Drug use: No  . Sexual activity: Yes    Birth  control/protection: None  Other Topics Concern  . Not on file  Social History Narrative  . Not on file   Environmental History: The patient lives in a 42 year old house with carpeting in the bedroom, gas heat, and central air.  There are 2 cats in the home which have access to her bedroom.  She is a non-smoker.  There is no known mold/water damage in the home.  Allergies as of 02/08/2017      Reactions   Tussionex Pennkinetic Er [hydrocod Polst-cpm Polst Er] Other (See Comments)   Caused nightmres   Naproxen Nausea And Vomiting      Medication List        Accurate as of 02/08/17  5:24 PM. Always use your most recent med list.          ADVAIR DISKUS 250-50 MCG/DOSE Aepb Generic drug:  Fluticasone-Salmeterol Inhale 2 puffs into the lungs 2 (two) times daily.   ALPRAZolam 1 MG tablet Commonly known as:  XANAX TAKE 1 TABLET BY MOUTH DAILY AS NEEDED FOR ANXIETY   AMBIEN CR 12.5 MG CR tablet Generic drug:  zolpidem Take 12.5 mg by mouth at bedtime as needed for sleep.   amLODipine 5 MG tablet Commonly known as:  NORVASC Take 5 mg by mouth daily with supper.   CALCIUM 500 PO Take  1,000 mg by mouth daily. When she remembers   divalproex 500 MG 24 hr tablet Commonly known as:  DEPAKOTE ER Take 1,500 mg by mouth daily with supper.   EPINEPHrine 0.3 mg/0.3 mL Soaj injection Commonly known as:  AUVI-Q Use as directed for severe allergic reactions   lamoTRIgine 200 MG tablet Commonly known as:  LAMICTAL 200 mg. Takes 1/2 tablet once daily.   metFORMIN 500 MG tablet Commonly known as:  GLUCOPHAGE Take 500 mg by mouth 2 (two) times daily with a meal.   mometasone-formoterol 200-5 MCG/ACT Aero Commonly known as:  DULERA Inhale 2 puffs into the lungs 2 (two) times daily. Use with spacer. Rinse, gargle and spit out after use   MULTIVITAMIN ADULT PO Take by mouth.   predniSONE 10 MG (21) Tbpk tablet Commonly known as:  STERAPRED UNI-PAK 21 TAB 6 tablets daily for 2 days, then decrease by one tablet every 2 days until complete.   PROAIR HFA 108 (90 Base) MCG/ACT inhaler Generic drug:  albuterol Inhale 2 puffs into the lungs every 4 (four) hours as needed for wheezing or shortness of breath.   QUEtiapine 200 MG tablet Commonly known as:  SEROQUEL Take 200 mg by mouth at bedtime. Pt. Takes 3 tablets at night.   Tiotropium Bromide Monohydrate 1.25 MCG/ACT Aers Commonly known as:  SPIRIVA RESPIMAT Inhale 2 puffs into the lungs daily.   VITAMIN B-12 IJ Inject as directed. 1 monthly   Vitamin D 2000 units Caps Take 1 capsule by mouth daily. When she remembers       Known medication allergies: Allergies  Allergen Reactions  . Tussionex Pennkinetic Er [Hydrocod Polst-Cpm Polst Er] Other (See Comments)    Caused nightmres  . Naproxen Nausea And Vomiting    I appreciate the opportunity to take part in Rayette's care. Please do not hesitate to contact me with questions.  Sincerely,   R. Jorene Guest, MD

## 2017-02-08 NOTE — Assessment & Plan Note (Signed)
   The patient will resume aeroallergen immunotherapy under our care using the allergen vaccines and protocol provided by her previous allergist. She has agreed to have access to epinephrine autoinjector.  If needed, add Nasacort AQ and/or fexofenadine (Allegra).  Return for follow-up in 6 months, or sooner if necessary.

## 2017-02-08 NOTE — Assessment & Plan Note (Addendum)
Currently with suboptimal control.  A severe asthma screening panel has been ordered to help identify phenotype and assess candidacy for a biologic agent.  These labs include: CBC with differential, serum IgE level, Aspergillus fumigatus serum specific IgE level, Aspergillus IgG precipitins panel, alpha-1 antitrypsin, and ANCA titers.  A prescription has been [facility] (mometasone/formoterol) 200/5 g, 2 inhalations twice a day. To maximize pulmonary deposition, a spacer has been provided along with instructions for its proper administration with an HFA inhaler.  A prescription has been provided for Spiriva Respimat 1.25 g, 2 inhalations daily.  Subjective and objective measures of pulmonary function will be followed and the treatment plan will be adjusted accordingly.

## 2017-02-08 NOTE — Patient Instructions (Addendum)
Moderate persistent asthma Currently with suboptimal control.  A severe asthma screening panel has been ordered to help identify phenotype and assess candidacy for a biologic agent.  These labs include: CBC with differential, serum IgE level, Aspergillus fumigatus serum specific IgE level, Aspergillus IgG precipitins panel, alpha-1 antitrypsin, and ANCA titers.  A prescription has been [facility] (mometasone/formoterol) 200/5 g,  2 inhalations twice a day. To maximize pulmonary deposition, a spacer has been provided along with instructions for its proper administration with an HFA inhaler.  A prescription has been provided for Spiriva Respimat 1.25 g, 2 inhalations daily.  Subjective and objective measures of pulmonary function will be followed and the treatment plan will be adjusted accordingly.  Other allergic rhinitis  The patient will resume aeroallergen immunotherapy under our care using the allergen vaccines and protocol provided by her previous allergist. She has agreed to have access to epinephrine autoinjector.  If needed, add Nasacort AQ and/or fexofenadine (Allegra).  Return for follow-up in 6 months, or sooner if necessary.   Return in about 6 weeks (around 03/22/2017), or if symptoms worsen or fail to improve.

## 2017-02-12 LAB — CBC WITH DIFFERENTIAL/PLATELET
Basophils Absolute: 0.1 10*3/uL (ref 0.0–0.2)
Basos: 1 %
EOS (ABSOLUTE): 0.2 10*3/uL (ref 0.0–0.4)
Eos: 3 %
Hematocrit: 42.6 % (ref 34.0–46.6)
Hemoglobin: 14.5 g/dL (ref 11.1–15.9)
Immature Grans (Abs): 0 10*3/uL (ref 0.0–0.1)
Immature Granulocytes: 0 %
Lymphocytes Absolute: 3.2 10*3/uL — ABNORMAL HIGH (ref 0.7–3.1)
Lymphs: 52 %
MCH: 32.6 pg (ref 26.6–33.0)
MCHC: 34 g/dL (ref 31.5–35.7)
MCV: 96 fL (ref 79–97)
Monocytes Absolute: 0.5 10*3/uL (ref 0.1–0.9)
Monocytes: 7 %
Neutrophils Absolute: 2.3 10*3/uL (ref 1.4–7.0)
Neutrophils: 37 %
Platelets: 332 10*3/uL (ref 150–379)
RBC: 4.45 x10E6/uL (ref 3.77–5.28)
RDW: 13.5 % (ref 12.3–15.4)
WBC: 6.1 10*3/uL (ref 3.4–10.8)

## 2017-02-12 LAB — ANCA TITERS
Atypical pANCA: <1:20 {titer}
C-ANCA: <1:20 {titer}
P-ANCA: <1:20 {titer}

## 2017-02-12 LAB — M003-IGE ASPERGILLUS FUMIGATUS: Aspergillus Fumigatus IgE: <0.1 kU/L

## 2017-02-12 LAB — ASPERGILLUS PRECIPITINS
A.Fumigatus #1 Abs: NEGATIVE
Aspergillus Flavus Antibodies: NEGATIVE
Aspergillus Niger Antibodies: NEGATIVE

## 2017-02-12 LAB — IGE: IgE (Immunoglobulin E), Serum: 28 IU/mL (ref 0–100)

## 2017-02-12 LAB — ALPHA-1-ANTITRYPSIN: A-1 Antitrypsin: 74 mg/dL — ABNORMAL LOW (ref 90–200)

## 2017-02-15 ENCOUNTER — Telehealth: Payer: Self-pay

## 2017-02-15 NOTE — Telephone Encounter (Signed)
Patient wants to inform Dr. Nunzio CobbsBobbitt she is picking up her ITX vials today that she is currently on at [facility] to continue the remainder of the vials here.  Patient states when these vials are empty, Dr. Nunzio CobbsBobbitt plans to mix her ITX here and continue allergy injections.  I did not see in your note from 02/08/17 that you plan to mix her ITX here for future vials.  Is this ok? I don't know how much is left in her current vials until she brings them in today or tomorrow.   Elon JesterMichele had spoken with allergy nurse at [facility]. Do you need any notes or what is in her vials to mix?

## 2017-02-15 NOTE — Telephone Encounter (Signed)
Patient called wanting to know if we received her allergy vials from [facility].  Per chart, Dr. Nunzio CobbsBobbitt stated she could bring her current allergy vials to us for continued administration.  Elon JesterMichele was patients nurse at [facility] with Dr. Nunzio CobbsBobbitt.   Elon JesterMichele [facility] and [facility].  [facility] are still [facility] at [facility].  Patient request we call [facility] to see if she can go there today and get an allergy injection and pick up her vials.  [facility]  Per Olegario MessierKathy, patient can come by their clinic today to get an allergy injection (they are open until 4:30 pm today) and pick up her vials and bring them to our clinic.  Olegario MessierKathy got prior approval from Dr. Jana HalfEmory to release her vials to her and continue receiving ITX with Dr. Nunzio CobbsBobbitt.  Elon JesterMichele has all forms that need to be signed by patient to receive her allergy shots in our injection room.  Tried to call patient back.  Had to leave message.

## 2017-02-15 NOTE — Telephone Encounter (Signed)
Yes, I will mix her vials for her.  We just need the antigen recipe from the ENT.  I believe that I had scanned the vial recipe into her chart (or more accurately, I put the recipe into the scanned pile).

## 2017-02-15 NOTE — Telephone Encounter (Signed)
Patient called back.  She will go today and get her allergy injection and pick [facility] her [facility].  She will try to bring the vials to us this afternoon with all paperwork for administration.  If it it after 5 pm, she will bring vials by tomorrow.  Patient waiting on Auvi Q 0.3 mg to be sent to her home  Patient wants Dr. Nunzio CobbsBobbitt to make future vials once the vials from the ENT are empty.  Make sure Dr. Nunzio CobbsBobbitt is aware when to make her vials for ITX here.  Patient stated this was the plan agreed to by Dr. Nunzio CobbsBobbitt and herself.

## 2017-02-16 LAB — SPECIMEN STATUS REPORT

## 2017-02-16 LAB — ALPHA-1 ANTITRYPSIN PHENOTYPE: A-1 Antitrypsin: 72 mg/dL — ABNORMAL LOW (ref 90–200)

## 2017-02-16 NOTE — Telephone Encounter (Signed)
Spoke to pt. She did receive her injections yesterday at 1.0 and Kathy at [facility] said next dose would be 1.5. Pt. Will bring her vials next Wednesday or Thursday injection hours given to pt. pt. Stated kathy [facility] is suppose to be faxing paper work. Told pt. I haven't received it yet. Told pt. I will check to see if recipe has been scanned in if not will call kathy [facility] for them to refax. Dr. Nunzio CobbsBobbitt will make new recipe for pt. To receive injections with our facility.

## 2017-02-21 ENCOUNTER — Telehealth: Payer: Self-pay

## 2017-02-21 ENCOUNTER — Encounter: Payer: Self-pay | Admitting: Allergy and Immunology

## 2017-02-21 NOTE — Telephone Encounter (Signed)
The AAT level is lower than the normal range, however it is not low enough to warrant alpha1-antitrypsin replacement therapy. Sorry for the delay in the report on the lab results.

## 2017-02-21 NOTE — Telephone Encounter (Signed)
Good morning,     I am wondering about my test results. I have not heard back with the official report from the doctor. I see that my AAT is lower than the normal range. Is this something that needs to be followed up on? It will be two weeks tomorrow since the test was ordered and I still have not been contacted about the results.     Feedback is appreciated.     Thank you    Please advise

## 2017-02-22 NOTE — Telephone Encounter (Signed)
Mychart message sent to patient.

## 2017-04-05 ENCOUNTER — Ambulatory Visit: Payer: BLUE CROSS/BLUE SHIELD | Admitting: Allergy and Immunology

## 2019-05-08 ENCOUNTER — Other Ambulatory Visit: Payer: Self-pay

## 2019-05-08 ENCOUNTER — Ambulatory Visit (INDEPENDENT_AMBULATORY_CARE_PROVIDER_SITE_OTHER): Payer: BC Managed Care – PPO | Admitting: Pediatrics

## 2019-05-08 ENCOUNTER — Encounter: Payer: Self-pay | Admitting: Pediatrics

## 2019-05-08 VITALS — BP 106/78 | HR 88 | Temp 98.4°F | Resp 16 | Ht 69.3 in | Wt 281.8 lb

## 2019-05-08 DIAGNOSIS — J3089 Other allergic rhinitis: Secondary | ICD-10-CM

## 2019-05-08 DIAGNOSIS — J4540 Moderate persistent asthma, uncomplicated: Secondary | ICD-10-CM | POA: Diagnosis not present

## 2019-05-08 MED ORDER — FLUTICASONE PROPIONATE 50 MCG/ACT NA SUSP: NASAL | 5 refills | Status: AC

## 2019-05-08 MED ORDER — AZITHROMYCIN 250 MG PO TABS: ORAL_TABLET | ORAL | 0 refills | Status: DC

## 2019-05-08 MED ORDER — ALBUTEROL SULFATE HFA 108 (90 BASE) MCG/ACT IN AERS: 2.0000 | INHALATION_SPRAY | RESPIRATORY_TRACT | 1 refills | Status: AC | PRN

## 2019-05-08 MED ORDER — FLOVENT HFA 110 MCG/ACT IN AERO: INHALATION_SPRAY | RESPIRATORY_TRACT | 5 refills | Status: AC

## 2019-05-08 MED ORDER — MONTELUKAST SODIUM 10 MG PO TABS: 10.0000 mg | ORAL_TABLET | Freq: Every day | ORAL | 5 refills | Status: AC

## 2019-05-08 MED ORDER — CETIRIZINE HCL 10 MG PO TABS: ORAL_TABLET | ORAL | 5 refills | Status: AC

## 2019-05-08 NOTE — Patient Instructions (Addendum)
Asthma Start prednisone 10 mg- take one tablet twice a day for 4 days, then take one tablet on the fifth day and stop. Start Zithromax 250 mg - take 2 tablets on day one, then one tablet once a day for 4 days Start Flovent 110 mg- take 2 puffs twice a day with spacer to help prevent cough and wheeze. Start montelukast 10 mg take one tablet at night to help cough and wheeze May use albuterol 2 puffs every 4 hours as needed for cough, wheeze, shortness of breath or tightness in chest. Also, may use albuterol 2 puffs 5-15 minutes prior to exercise.  Allergic rhinitis Start fluticasone nose spray- use 2 sprays each nostril once a day as needed for stuffy nose. Start cetirizine 10 mg take one tablet once a day as needed for runny nose and itching Stop claritin  Consider allergy injections as a means of long-term control. - Allergy injections "re-train" and "reset" the immune system to ignore environmental allergens and decrease the resulting immune response to those allergens (sneezing, itchy watery eyes, runny nose, nasal congestion, etc).    - Allergy injections improve symptoms in 75-85% of patients.   - We can discuss this more at the next appointment if the medications are not working for you.   Continue all other current medications Please let us know if this treatment plan is not working well for you  Schedule follow up appointment in one month

## 2019-05-08 NOTE — Progress Notes (Signed)
[address] [place] [address] Dept: [phone]  FOLLOW UP NOTE  Patient ID: Kelli Barnett, female    DOB: 05-Aug-1975  Age: 44 y.o. MRN: [id number] Date of Office Visit: 05/08/2019  Assessment  Chief Complaint: Allergic Rhinitis  (nasal congestion sneezing and watery eyes, ) and Asthma (wheeze  worse at night.)  HPI Kelli Barnett is a 44 year old female who presents today for follow-up of moderate persistent asthma and allergic rhinitis.  She was last seen on February 08, 2017 by Dr. Nunzio Cobbs. Asthma is reported as not well controlled with the use of albuterol as needed.  She has been off of Dulera and Spiriva since around early 2020 and she reports that her asthma has been getting worse in the past year.  She reports that she has noticed that her asthma is aggravated by dust, pollen, and her new dogs that she got this past fall.  She is currently using her albuterol once a week.  She reports a productive cough with yellow sputum for the past week, wheezing, tightness in her chest at times, shortness of breath with exertion and nocturnal awakenings.  In the past year she has not required systemic steroids or trips to the emergency room or urgent care. Allergic rhinitis is reported as not well controlled with occasional runny/stuffy nose, sneezing and nasal dryness.  She is currently using Claritin as needed with minimal relief of symptoms and saline spray with minimal relief.  She reports that she has done allergy injections in the past for about 6 to 7 months and is interested in being skin tested again to environmental inhalants so she can restart allergy injections.  She currently has 2 cats and dogs at home.   Drug Allergies:  Allergies  Allergen Reactions  . Tussionex Pennkinetic Er [Hydrocod Polst-Cpm Polst Er] Other (See Comments)    Caused nightmres  . Naproxen Nausea And Vomiting    Physical Exam: BP 106/78 (BP Location: Left Arm, Patient Position: Sitting, Cuff  Size: Large)   Pulse 88   Temp 98.4 F (36.9 C) (Oral)   Resp 16   Ht 5' 9.3" (1.76 m)   Wt 281 lb 12.8 oz (127.8 kg)   SpO2 98%   BMI 41.26 kg/m    Physical Exam Constitutional:      Appearance: Normal appearance.  HENT:     Head: Normocephalic and atraumatic.     Comments: Pharynx normal. Eyes normal. Ears normal. Nose: clear drainage noted.    Right Ear: Tympanic membrane, ear canal and external ear normal.     Left Ear: Tympanic membrane, ear canal and external ear normal.     Mouth/Throat:     Mouth: Mucous membranes are moist.  Eyes:     Conjunctiva/sclera: Conjunctivae normal.  Cardiovascular:     Rate and Rhythm: Normal rate and regular rhythm.     Heart sounds: Normal heart sounds.  Pulmonary:     Effort: Pulmonary effort is normal.     Breath sounds: Normal breath sounds.     Comments: Lungs clear to auscultation. Skin:    General: Skin is warm and dry.  Neurological:     General: No focal deficit present.     Mental Status: She is alert and oriented to person, place, and time.  Psychiatric:        Mood and Affect: Mood normal.        Behavior: Behavior normal.        Thought Content: Thought content normal.  Judgment: Judgment normal.     Diagnostics: FVC 3.63 L FEV1 3.17 L.  Predicted FVC 4.26 L, FEV1 3.42 L.  Spirometry indicates normal ventilatory function.  Assessment and Plan: 1. Moderate persistent asthma without complication   2. Other allergic rhinitis     Meds ordered this encounter  Medications  . azithromycin (ZITHROMAX) 250 MG tablet    Sig: Take 2 tablets on day one, then one tablet once a day for next 4 days, then stop    Dispense:  6 each    Refill:  0  . fluticasone (FLOVENT HFA) 110 MCG/ACT inhaler    Sig: Two puffs with spacer twice a day to prevent cough or wheeze.    Dispense:  12 g    Refill:  5  . montelukast (SINGULAIR) 10 MG tablet    Sig: Take 1 tablet (10 mg total) by mouth at bedtime.    Dispense:  30 tablet     Refill:  5  . albuterol (PROAIR HFA) 108 (90 Base) MCG/ACT inhaler    Sig: Inhale 2 puffs into the lungs every 4 (four) hours as needed for wheezing or shortness of breath.    Dispense:  18 g    Refill:  1  . fluticasone (FLONASE) 50 MCG/ACT nasal spray    Sig: Two sprays each nostril once a day as needed for nasal congestion or drainage.    Dispense:  16 g    Refill:  5  . cetirizine (ZYRTEC) 10 MG tablet    Sig: One tablet once a day if needed for runny nose or itchy eyes.    Dispense:  30 tablet    Refill:  5    Patient Instructions  Asthma Start prednisone 10 mg- take one tablet twice a day for 4 days, then take one tablet on the fifth day and stop. Start Zithromax 250 mg - take 2 tablets on day one, then one tablet once a day for 4 days Start Flovent 110 mg- take 2 puffs twice a day with spacer to help prevent cough and wheeze. Start montelukast 10 mg take one tablet at night to help cough and wheeze May use albuterol 2 puffs every 4 hours as needed for cough, wheeze, shortness of breath or tightness in chest. Also, may use albuterol 2 puffs 5-15 minutes prior to exercise.  Allergic rhinitis Start fluticasone nose spray- use 2 sprays each nostril once a day as needed for stuffy nose. Start cetirizine 10 mg take one tablet once a day as needed for runny nose and itching Stop claritin  Consider allergy injections as a means of long-term control. - Allergy injections "re-train" and "reset" the immune system to ignore environmental allergens and decrease the resulting immune response to those allergens (sneezing, itchy watery eyes, runny nose, nasal congestion, etc).    - Allergy injections improve symptoms in 75-85% of patients.   - We can discuss this more at the next appointment if the medications are not working for you.   Continue all other current medications Please let us know if this treatment plan is not working well for you  Schedule follow up appointment in one  month   Return in about 4 weeks (around 06/05/2019), or if symptoms worsen or fail to improve.   Thank you for the opportunity to care for this patient.  Please do not hesitate to contact me with questions.  Nehemiah Settle, FNP [facility]   I have provided  oversight concerning Leanne Sisler' evaluation and treatment of this patient's health issues addressed during today's encounter. I agree with the assessment and therapeutic plan as outlined in the note.   Thank you for the opportunity to care for this patient.  Please do not hesitate to contact me with questions.  Penne Lash, M.D.  [facility] [address] [place], [place] [address] [phone]

## 2019-06-04 ENCOUNTER — Ambulatory Visit: Payer: Self-pay | Admitting: Allergy and Immunology

## 2019-06-13 ENCOUNTER — Ambulatory Visit: Payer: BC Managed Care – PPO | Admitting: Family Medicine

## 2019-06-13 ENCOUNTER — Encounter: Payer: Self-pay | Admitting: Family Medicine

## 2019-06-13 ENCOUNTER — Other Ambulatory Visit: Payer: Self-pay

## 2019-06-13 VITALS — BP 96/70 | HR 96 | Temp 97.7°F | Resp 16

## 2019-06-13 DIAGNOSIS — J3089 Other allergic rhinitis: Secondary | ICD-10-CM

## 2019-06-13 DIAGNOSIS — K219 Gastro-esophageal reflux disease without esophagitis: Secondary | ICD-10-CM

## 2019-06-13 DIAGNOSIS — J4540 Moderate persistent asthma, uncomplicated: Secondary | ICD-10-CM

## 2019-06-13 MED ORDER — FLUTICASONE PROPIONATE 50 MCG/ACT NA SUSP: NASAL | 5 refills | Status: AC

## 2019-06-13 MED ORDER — OMEPRAZOLE 20 MG PO CPDR: 20.0000 mg | DELAYED_RELEASE_CAPSULE | Freq: Two times a day (BID) | ORAL | 5 refills | Status: AC

## 2019-06-13 MED ORDER — AZELASTINE HCL 0.1 % NA SOLN: NASAL | 5 refills | Status: AC

## 2019-06-13 MED ORDER — OMEPRAZOLE 20 MG PO CPDR: DELAYED_RELEASE_CAPSULE | ORAL | 5 refills | Status: AC

## 2019-06-13 NOTE — Patient Instructions (Addendum)
Asthma Continue montelukast 10 mg take one tablet at night to help cough and wheeze Continue Flovent 110 mg- take 2 puffs twice a day with spacer to help prevent cough and wheeze. Use albuterol 2 puffs every 4 hours as needed for cough, wheeze, shortness of breath or tightness in chest.  You may use albuterol 2 puffs 5-15 minutes prior to exercise.  Reflux Begin omeprazole 20 mg twice a day to control reflux. This may decrease your wheeze Continue dietary and lifestyle modifications  Allergic rhinitis Your skin testing today indicates mols was slightly positive. We will order labs to confirm these allergies. We will call you when these become available Begin azelastine nasal spray 2 sprays in each nostril twice a day as needed for a runny nose or sinus headache Continue fluticasone nose spray- use 2 sprays each nostril once a day as needed for stuffy nose. Continue cetirizine 10 mg take one tablet once a day as needed for runny nose and itching Consider saline nasal rinses as needed for nasal symptoms. Use this before any medicated nasal sprays for best result  Consider allergy injections as a means of long-term control. - Allergy injections "re-train" and "reset" the immune system to ignore environmental allergens and decrease the resulting immune response to those allergens (sneezing, itchy watery eyes, runny nose, nasal congestion, etc).    - Allergy injections improve symptoms in 75-85% of patients.   - We can discuss this more at the next appointment if the medications are not working for you.  Continue all other current medications  Please let us know if this treatment plan is not working well for you  Follow up in 3 months or sooner if needed.   Lifestyle Changes for Controlling GERD When you have GERD, stomach acid feels as if it's backing up toward your mouth. Whether or not you take medication to control your GERD, your symptoms can often be improved with lifestyle changes.    Raise Your Head  Reflux is more likely to strike when you're lying down flat, because stomach fluid can  flow backward more easily. Raising the head of your bed 4-6 inches can help. To do this:  Slide blocks or books under the legs at the head of your bed. Or, place a wedge under  the mattress. Many foam stores can make a suitable wedge for you. The wedge  should run from your waist to the top of your head.  Don't just prop your head on several pillows. This increases pressure on your  stomach. It can make GERD worse.  Watch Your Eating Habits Certain foods may increase the acid in your stomach or relax the lower esophageal sphincter, making GERD more likely. It's best to avoid the following:  Coffee, tea, and carbonated drinks (with and without caffeine)  Fatty, fried, or spicy food  Mint, chocolate, onions, and tomatoes  Any other foods that seem to irritate your stomach or cause you pain  Relieve the Pressure  Eat smaller meals, even if you have to eat more often.  Don't lie down right after you eat. Wait a few hours for your stomach to empty.  Avoid tight belts and tight-fitting clothes.  Lose excess weight.  Tobacco and Alcohol  Avoid smoking tobacco and drinking alcohol. They can make GERD symptoms worse.  Control of Mold Allergen Mold and fungi can grow on a variety of surfaces provided certain temperature and moisture conditions exist.  Outdoor molds grow on plants, decaying vegetation and soil.  The major outdoor mold, Alternaria and Cladosporium, are found in very high numbers during hot and dry conditions.  Generally, a late Summer - Fall peak is seen for common outdoor fungal spores.  Rain will temporarily lower outdoor mold spore count, but counts rise rapidly when the rainy period ends.  The most important indoor molds are Aspergillus and Penicillium.  Dark, humid and poorly ventilated basements are ideal sites for mold growth.  The next most common sites  of mold growth are the bathroom and the kitchen.  Outdoor [employer] 1. Use air conditioning and keep windows closed 2. Avoid exposure to decaying vegetation. 3. Avoid leaf raking. 4. Avoid grain handling. 5. Consider wearing a face mask if working in moldy areas.  Indoor Mold Control 1. Maintain humidity below 50%. 2. Clean washable surfaces with 5% bleach solution. 3. Remove sources e.g. Contaminated carpets.

## 2019-06-13 NOTE — Progress Notes (Signed)
[address] [place] [address] Dept: [phone]  FOLLOW UP NOTE  Patient ID: Kelli Barnett, female    DOB: 03/24/1975  Age: 44 y.o. MRN: [id number] Date of Office Visit: 06/13/2019  Assessment  Chief Complaint: Allergic Rhinitis  and Asthma  HPI Kelli Barnett is a 44 year old female who presents to the clinic for follow-up visit.  She was last seen in this clinic on 05/08/2019 by Dr. Quinn Plowman for evaluation of asthma and allergic rhinitis.  At that visit, she received azithromycin and prednisone.  At today's visit she reports her asthma has been moderately well controlled with some shortness of breath mostly occurring at the end of the day, wheeze that occurs every evening when she lies back a little bit to relax, and occasional dry cough.  She continues montelukast 10 mg once a day, Flovent 110 2 puffs twice a day with a spacer, and albuterol 3 times since her last visit to this clinic which provided relief.  Allergic rhinitis is reported as moderately well controlled with clear nasal drainage with activity postnasal drainage occurring mostly at night, and intermittent sneezing especially when she is with her pets.  She continues to use Flonase 1-2 times a week with good application technique and cetirizine as needed.  She denies any heartburn or signs of reflux.  She does report a history of reflux occurring in college for which she took medication which did not provide relief.  Her current medications are listed in the chart.   Drug Allergies:  Allergies  Allergen Reactions  . Tussionex Pennkinetic Er [Hydrocod Polst-Cpm Polst Er] Other (See Comments)    Caused nightmres  . Naproxen Nausea And Vomiting    Physical Exam: BP 96/70 (BP Location: Left Arm, Patient Position: Sitting, Cuff Size: Large)   Pulse 96   Temp 97.7 F (36.5 C) (Oral)   Resp 16   SpO2 98%    Physical Exam Vitals reviewed.  Constitutional:      Appearance: Normal appearance.  HENT:   Head: Normocephalic and atraumatic.     Right Ear: Tympanic membrane normal.     Left Ear: Tympanic membrane normal.     Nose:     Comments: Bilateral nares normal.  Septal deviation noted.  Pharynx slightly erythematous.  No exudate noted.  Ears normal.  Eyes normal.    Mouth/Throat:     Pharynx: Oropharynx is clear.  Eyes:     Conjunctiva/sclera: Conjunctivae normal.  Cardiovascular:     Rate and Rhythm: Normal rate and regular rhythm.     Heart sounds: Normal heart sounds. No murmur.  Pulmonary:     Effort: Pulmonary effort is normal.     Breath sounds: Normal breath sounds.     Comments: Lungs clear to auscultation Musculoskeletal:        General: Normal range of motion.     Cervical back: Normal range of motion and neck supple.  Skin:    General: Skin is warm and dry.  Neurological:     Mental Status: She is alert and oriented to person, place, and time.  Psychiatric:        Mood and Affect: Mood normal.        Behavior: Behavior normal.        Thought Content: Thought content normal.        Judgment: Judgment normal.     Diagnostics: FVC 3.72, FEV1 3.42.  Predicted FVC 4.26, predicted FEV1 3.42.  Spirometry indicates normal ventilatory function.  Percutaneous environmental skin testing was negative with adequate controls.  Intradermal testing was slightly positive to major mold mix 2 and major mold mix 4 with adequate control.  Assessment and Plan: 1. Non-seasonal allergic rhinitis due to fungal spores   2. Moderate persistent asthma without complication   3. Gastroesophageal reflux disease, unspecified whether esophagitis present     Meds ordered this encounter  Medications  . omeprazole (PRILOSEC) 20 MG capsule    Sig: Take 1 capsule (20 mg total) by mouth 2 (two) times daily before a meal.    Dispense:  30 capsule    Refill:  5  . azelastine (ASTELIN) 0.1 % nasal spray    Sig: 2 sprays per nostril twice daily for runny nose as needed for runny nose or sinus  headache    Dispense:  30 mL    Refill:  5  . fluticasone (FLONASE) 50 MCG/ACT nasal spray    Sig: 2 sprays once daily as needed for stuffy nose.    Dispense:  16 g    Refill:  5  . omeprazole (PRILOSEC) 20 MG capsule    Sig: Take 1 capsule twice daily for reflux    Dispense:  60 capsule    Refill:  5    Patient Instructions  Asthma Continue montelukast 10 mg take one tablet at night to help cough and wheeze Continue Flovent 110 mg- take 2 puffs twice a day with spacer to help prevent cough and wheeze. Use albuterol 2 puffs every 4 hours as needed for cough, wheeze, shortness of breath or tightness in chest.  You may use albuterol 2 puffs 5-15 minutes prior to exercise.  Reflux Begin omeprazole 20 mg twice a day to control reflux. This may decrease your wheeze Continue dietary and lifestyle modifications  Allergic rhinitis Your skin testing today indicates molds was slightly positive. We will order labs to confirm these allergies. We will call you when these become available Begin azelastine nasal spray 2 sprays in each nostril twice a day as needed for a runny nose or sinus headache Continue fluticasone nose spray- use 2 sprays each nostril once a day as needed for stuffy nose. Continue cetirizine 10 mg take one tablet once a day as needed for runny nose and itching Consider saline nasal rinses as needed for nasal symptoms. Use this before any medicated nasal sprays for best result  Consider allergy injections as a means of long-term control. - Allergy injections "re-train" and "reset" the immune system to ignore environmental allergens and decrease the resulting immune response to those allergens (sneezing, itchy watery eyes, runny nose, nasal congestion, etc).    - Allergy injections improve symptoms in 75-85% of patients.   - We can discuss this more at the next appointment if the medications are not working for you.  Continue all other current medications  Please let us know  if this treatment plan is not working well for you  Follow up in 3 months or sooner if needed.   Return in about 3 months (around 09/13/2019), or if symptoms worsen or fail to improve.    Thank you for the opportunity to care for this patient.  Please do not hesitate to contact me with questions.  Gareth Morgan, FNP [facility] [place] [facility] [place]

## 2019-06-23 NOTE — Addendum Note (Signed)
Addended by: Berna Bue on: 06/23/2019 10:48 AM   Modules accepted: Orders

## 2020-02-16 ENCOUNTER — Other Ambulatory Visit: Payer: Self-pay

## 2020-02-16 ENCOUNTER — Encounter (HOSPITAL_BASED_OUTPATIENT_CLINIC_OR_DEPARTMENT_OTHER): Payer: Self-pay | Admitting: *Deleted

## 2020-02-16 ENCOUNTER — Emergency Department (HOSPITAL_BASED_OUTPATIENT_CLINIC_OR_DEPARTMENT_OTHER): Payer: BC Managed Care – PPO

## 2020-02-16 DIAGNOSIS — M79641 Pain in right hand: Secondary | ICD-10-CM | POA: Diagnosis not present

## 2020-02-16 DIAGNOSIS — J45909 Unspecified asthma, uncomplicated: Secondary | ICD-10-CM | POA: Diagnosis not present

## 2020-02-16 DIAGNOSIS — R0789 Other chest pain: Secondary | ICD-10-CM | POA: Diagnosis present

## 2020-02-16 DIAGNOSIS — Z20822 Contact with and (suspected) exposure to covid-19: Secondary | ICD-10-CM | POA: Insufficient documentation

## 2020-02-16 DIAGNOSIS — M79642 Pain in left hand: Secondary | ICD-10-CM | POA: Diagnosis not present

## 2020-02-16 LAB — CBC
HCT: 43.9 % (ref 36.0–46.0)
Hemoglobin: 14.5 g/dL (ref 12.0–15.0)
MCH: 30 pg (ref 26.0–34.0)
MCHC: 33 g/dL (ref 30.0–36.0)
MCV: 90.7 fL (ref 80.0–100.0)
Platelets: 373 10*3/uL (ref 150–400)
RBC: 4.84 MIL/uL (ref 3.87–5.11)
RDW: 16.6 % — ABNORMAL HIGH (ref 11.5–15.5)
WBC: 10.7 10*3/uL — ABNORMAL HIGH (ref 4.0–10.5)
nRBC: 0 % (ref 0.0–0.2)

## 2020-02-16 LAB — BASIC METABOLIC PANEL
Anion gap: 14 (ref 5–15)
BUN: 14 mg/dL (ref 6–20)
CO2: 22 mmol/L (ref 22–32)
Calcium: 8.8 mg/dL — ABNORMAL LOW (ref 8.9–10.3)
Chloride: 98 mmol/L (ref 98–111)
Creatinine, Ser: 0.73 mg/dL (ref 0.44–1.00)
GFR, Estimated: >60 mL/min (ref >60)
Glucose, Bld: 83 mg/dL (ref 70–99)
Potassium: 3.7 mmol/L (ref 3.5–5.1)
Sodium: 134 mmol/L — ABNORMAL LOW (ref 135–145)

## 2020-02-16 LAB — PREGNANCY, URINE: Preg Test, Ur: NEGATIVE

## 2020-02-16 LAB — TROPONIN I (HIGH SENSITIVITY): Troponin I (High Sensitivity): 3 ng/L (ref <18)

## 2020-02-16 NOTE — ED Notes (Signed)
Pt called for blood draw but pt in the bathroom

## 2020-02-16 NOTE — ED Triage Notes (Signed)
Substernal CP with SOB, bilateral hand and arm pain. Reports HTN and tachycardia PTA.  Pt had infections in her back this afternoon for back pain.

## 2020-02-17 ENCOUNTER — Emergency Department (HOSPITAL_BASED_OUTPATIENT_CLINIC_OR_DEPARTMENT_OTHER)
Admission: EM | Admit: 2020-02-17 | Discharge: 2020-02-17 | Disposition: A | Discharge status: Home / Self Care | Payer: BC Managed Care – PPO | Attending: Emergency Medicine | Admitting: Emergency Medicine

## 2020-02-17 DIAGNOSIS — R0789 Other chest pain: Secondary | ICD-10-CM

## 2020-02-17 DIAGNOSIS — Z20822 Contact with and (suspected) exposure to covid-19: Secondary | ICD-10-CM

## 2020-02-17 LAB — TROPONIN I (HIGH SENSITIVITY): Troponin I (High Sensitivity): 3 ng/L (ref <18)

## 2020-02-17 LAB — SARS CORONAVIRUS 2 (TAT 6-24 HRS): SARS Coronavirus 2: NEGATIVE

## 2020-02-17 NOTE — ED Provider Notes (Signed)
[facility] Provider Note: Kelli Spurling, MD, FACEP  CSN: [id number] MRN: [id number] ARRIVAL: 02/16/20 at 2001 ROOM: [place]  Chest Pain   HISTORY OF PRESENT ILLNESS  02/17/20 1:22 AM Kelli Barnett is a 45 y.o. female who had several lumbar spine injections yesterday for back pain.  About 7:15 PM she experienced rapid heart rate that as high as 135.  This is associated by a sense of tightness in her chest as well as pain when she coughs or takes a deep breath.  The pain is located anteriorly.  She rates the discomfort about a 3 out of 10.  She describes it as a pressure.  She has had no fever, cough, body aches, loss of taste or smell.  She has had some bilateral hand and arm pain.   Past Medical History:  Diagnosis Date  . Anemia    hx  . Anxiety   . Asthma    rarely uses inhaler - uses inhaler when she gets sick  . Bipolar disorder ([place])   . Chronic back pain   . Depression    no meds few yrs  . Environmental and seasonal allergies    tx with weekly allergy injections - Thursday  . Headache    last one 2 months ago  . Heart murmur    child echo done as child  . Lumbar vertebral fracture (HCC)   . Migraine   . Obesity    tx with metformin  . Seizures ([place])    hx childhood epilepsy- last seizure 45 yrs old, no problems as adult, no meds  . SVD (spontaneous vaginal delivery)    x 2    Past Surgical History:  Procedure Laterality Date  . BACK SURGERY  2015   L5-S1 fusion  . CHOLECYSTECTOMY    . COLONOSCOPY     polyps  . DILITATION & CURRETTAGE/HYSTROSCOPY WITH NOVASURE ABLATION N/A 01/11/2017   Procedure: DILATATION & CURETTAGE/HYSTEROSCOPY WITH NOVASURE ABLATION;  Surgeon: Brien Few, MD;  Location: [facility];  Service: Gynecology;  Laterality: N/A;  . GANGLION CYST EXCISION Right    wrist  . GASTRIC BYPASS  2009  . ULNAR NERVE TRANSPOSITION Left 03  . UPPER GI ENDOSCOPY    . WISDOM TOOTH EXTRACTION      Family  History  Problem Relation Age of Onset  . Allergic rhinitis Mother   . Asthma Mother   . Allergic rhinitis Sister   . Asthma Sister   . Eczema Sister   . Migraines Sister   . Allergic rhinitis Paternal Aunt   . Angioedema Neg Hx   . Immunodeficiency Neg Hx   . Urticaria Neg Hx     Social History   Tobacco Use  . Smoking status: Never Smoker  . Smokeless tobacco: Never Used  Vaping Use  . Vaping Use: Never used  Substance Use Topics  . Alcohol use: Yes    Comment: occassional  . Drug use: No    Prior to Admission medications   Medication Sig Start Date End Date Taking? Authorizing Provider  albuterol (PROAIR HFA) 108 (90 Base) MCG/ACT inhaler Inhale 2 puffs into the lungs every 4 (four) hours as needed for wheezing or shortness of breath. 05/08/19   Bardelas, Jens Som, MD  ALPRAZolam (XANAX) 1 MG tablet TAKE 1 TABLET BY MOUTH DAILY AS NEEDED FOR ANXIETY 11/17/16   [provider]  azelastine (ASTELIN) 0.1 % nasal spray 2 sprays per nostril twice daily  for runny nose as needed for runny nose or sinus headache 06/13/19   Dara Hoyer, FNP  Blood Glucose Monitoring Suppl (FIFTY50 GLUCOSE METER 2.0) w/Device KIT Use as instructed 06/10/19   [provider]  Calcium-Magnesium-Vitamin D (CALCIUM 500 PO) Take 500 mg by mouth daily. When she remembers    [provider]  cetirizine (ZYRTEC) 10 MG tablet One tablet once a day if needed for runny nose or itchy eyes. 05/08/19   Charlies Silvers, MD  chlorhexidine (PERIDEX) 0.12 % solution 15 mLs 2 (two) times daily. 04/22/19   [provider]  Cholecalciferol 1.25 MG (50000 UT) capsule  05/20/19   [provider]  divalproex (DEPAKOTE ER) 500 MG 24 hr tablet Take 2,000 mg by mouth daily with supper.     [provider]  EPINEPHrine (AUVI-Q) 0.3 mg/0.3 mL IJ SOAJ injection Use as directed for severe allergic reactions 02/08/17   Bobbitt, Sedalia Muta, MD  fluticasone [facility]) 50 MCG/ACT nasal spray  Two sprays each nostril once a day as needed for nasal congestion or drainage. 05/08/19   Charlies Silvers, MD  fluticasone (FLONASE) 50 MCG/ACT nasal spray 2 sprays once daily as needed for stuffy nose. 06/13/19   Dara Hoyer, FNP  fluticasone (FLOVENT HFA) 110 MCG/ACT inhaler Two puffs with spacer twice a day to prevent cough or wheeze. Patient taking differently: Inhale 2 puffs into the lungs as needed. Two puffs with spacer twice a day to prevent cough or wheeze. 05/08/19   Charlies Silvers, MD  lamoTRIgine (LAMICTAL) 150 MG tablet Take 150 mg by mouth daily. 05/02/19   [provider]  meloxicam (MOBIC) 15 MG tablet Take 15 mg by mouth daily. 03/20/19   [provider]  metaxalone (SKELAXIN) 800 MG tablet Take 800 mg by mouth 3 (three) times daily. 04/29/19   [provider]  montelukast (SINGULAIR) 10 MG tablet Take 1 tablet (10 mg total) by mouth at bedtime. 05/08/19   Charlies Silvers, MD  Multiple Vitamin (MULTI-VITAMIN) tablet Take by mouth.    [provider]  Multiple Vitamins-Minerals (MULTIVITAMIN ADULT PO) Take by mouth.    [provider]  omeprazole (PRILOSEC) 20 MG capsule Take 1 capsule (20 mg total) by mouth 2 (two) times daily before a meal. 06/13/19   Ambs, Kathrine Cords, FNP  omeprazole (PRILOSEC) 20 MG capsule Take 1 capsule twice daily for reflux 06/13/19   Ambs, Kathrine Cords, FNP  pramipexole (MIRAPEX) 1.5 MG tablet Take 1.5 mg by mouth at bedtime.    [provider]  QUEtiapine (SEROQUEL) 200 MG tablet Take 600 mg by mouth at bedtime. Pt. Takes 3 tablets at night.     [provider]  terbinafine (LAMISIL) 250 MG tablet Take 250 mg by mouth daily. 04/29/19   [provider]  ZOLMitriptan (ZOMIG) 2.5 MG tablet  02/17/19   [provider]    Allergies Tussionex pennkinetic er [hydrocod polst-cpm polst er] and Naproxen   REVIEW OF SYSTEMS  Negative except as noted here or in the History of Present  Illness.   PHYSICAL EXAMINATION  Initial Vital Signs Blood pressure (!) 146/102, pulse 92, temperature 98.2 F (36.8 C), temperature source Oral, resp. rate 18, height 5' 9"  (1.753 m), weight 118.8 kg, SpO2 99 %.  Examination General: Well-developed, well-nourished female in no acute distress; appearance consistent with age of record HENT: normocephalic; atraumatic Eyes: pupils equal, round and reactive to light; extraocular muscles intact Neck: supple Heart: regular  rate and rhythm Lungs: clear to auscultation bilaterally Abdomen: soft; nondistended; nontender; bowel sounds present Extremities: No deformity; full range of motion; pulses normal Neurologic: Awake, alert and oriented; motor function intact in all extremities and symmetric; no facial droop Skin: Warm and dry Psychiatric: Normal mood and affect   RESULTS  Summary of this visit's results, reviewed and interpreted by myself:   EKG Interpretation  Date/Time:  Monday February 16 2020 20:04:19 EST Ventricular Rate:  112 PR Interval:  142 QRS Duration: 92 QT Interval:  330 QTC Calculation: 450 R Axis:   27 Text Interpretation: Sinus tachycardia Incomplete right bundle branch block Borderline ECG No significant change was found Confirmed by Rosser Collington, Kelli Barnett [phone]) on 02/17/2020 1:22:32 AM      Laboratory Studies: Results for orders placed or performed during the hospital encounter of 02/17/20 (from the past 24 hour(s))  Basic metabolic panel     Status: Abnormal   Collection Time: 02/16/20  8:40 PM  Result Value Ref Range   Sodium 134 (L) 135 - 145 mmol/L   Potassium 3.7 3.5 - 5.1 mmol/L   Chloride 98 98 - 111 mmol/L   CO2 22 22 - 32 mmol/L   Glucose, Bld 83 70 - 99 mg/dL   BUN 14 6 - 20 mg/dL   Creatinine, Ser 0.73 0.44 - 1.00 mg/dL   Calcium 8.8 (L) 8.9 - 10.3 mg/dL   GFR, Estimated >60 >60 mL/min   Anion gap 14 5 - 15  CBC     Status: Abnormal   Collection Time: 02/16/20  8:40 PM  Result Value Ref Range    WBC 10.7 (H) 4.0 - 10.5 K/uL   RBC 4.84 3.87 - 5.11 MIL/uL   Hemoglobin 14.5 12.0 - 15.0 g/dL   HCT 43.9 36.0 - 46.0 %   MCV 90.7 80.0 - 100.0 fL   MCH 30.0 26.0 - 34.0 pg   MCHC 33.0 30.0 - 36.0 g/dL   RDW 16.6 (H) 11.5 - 15.5 %   Platelets 373 150 - 400 K/uL   nRBC 0.0 0.0 - 0.2 %  Troponin I (High Sensitivity)     Status: None   Collection Time: 02/16/20  8:40 PM  Result Value Ref Range   Troponin I (High Sensitivity) 3 <18 ng/L  Pregnancy, urine     Status: None   Collection Time: 02/16/20  8:45 PM  Result Value Ref Range   Preg Test, Ur NEGATIVE NEGATIVE  Troponin I (High Sensitivity)     Status: None   Collection Time: 02/17/20  1:36 AM  Result Value Ref Range   Troponin I (High Sensitivity) 3 <18 ng/L   Imaging Studies: DG Chest 2 View  Result Date: 02/16/2020 CLINICAL DATA:  Substernal chest pain and shortness of breath. EXAM: CHEST - 2 VIEW COMPARISON:  None. FINDINGS: The cardiomediastinal contours are normal. The lungs are clear. Pulmonary vasculature is normal. No consolidation, pleural effusion, or pneumothorax. No acute osseous abnormalities are seen. IMPRESSION: Negative radiographs of the chest. Electronically Signed   By: Keith Rake M.D.   On: 02/16/2020 20:45    ED COURSE and MDM  Nursing notes, initial and subsequent vitals signs, including pulse oximetry, reviewed and interpreted by myself.  Vitals:   02/17/20 0031 02/17/20 0100 02/17/20 0130 02/17/20 0230  BP: (!) 146/102 (!) 132/92 (!) 143/88 (!) 135/94  Pulse: 92 87 86 92  Resp: 18 17 16 17   Temp: 98.2 F (36.8 C)     TempSrc: Oral  SpO2: 99% 96% 100% 94%  Weight:      Height:       Medications - No data to display  3:39 AM Troponins are normal.  Pain is atypical for cardiac etiology but is consistent with what we are seeing associated with Covid.  Covid test pending.  PROCEDURES  Procedures   ED DIAGNOSES     ICD-10-CM   1. Atypical chest pain  R07.89   2. Suspected COVID-19  virus infection  Z20.822        Maisey Deandrade, Kelli Reichmann, MD 02/17/20 [phone]

## 2022-01-25 ENCOUNTER — Other Ambulatory Visit: Payer: Self-pay | Admitting: Orthopedic Surgery

## 2022-01-25 DIAGNOSIS — M4316 Spondylolisthesis, lumbar region: Secondary | ICD-10-CM

## 2022-03-06 ENCOUNTER — Encounter: Payer: Self-pay | Admitting: Orthopedic Surgery

## 2022-03-11 ENCOUNTER — Ambulatory Visit
Admission: RE | Admit: 2022-03-11 | Discharge: 2022-03-11 | Disposition: A | Discharge status: Home / Self Care | Payer: BC Managed Care – PPO | Source: Ambulatory Visit | Attending: Orthopedic Surgery | Admitting: Orthopedic Surgery

## 2022-03-11 DIAGNOSIS — M4316 Spondylolisthesis, lumbar region: Secondary | ICD-10-CM

## 2022-11-06 HISTORY — PX: TRIGGER FINGER RELEASE: SHX641

## 2023-02-23 ENCOUNTER — Emergency Department (HOSPITAL_BASED_OUTPATIENT_CLINIC_OR_DEPARTMENT_OTHER): Payer: BC Managed Care – PPO

## 2023-02-23 ENCOUNTER — Other Ambulatory Visit: Payer: Self-pay

## 2023-02-23 ENCOUNTER — Emergency Department (HOSPITAL_BASED_OUTPATIENT_CLINIC_OR_DEPARTMENT_OTHER)
Admission: EM | Admit: 2023-02-23 | Discharge: 2023-02-23 | Disposition: A | Discharge status: Home / Self Care | Payer: BC Managed Care – PPO | Attending: Emergency Medicine | Admitting: Emergency Medicine

## 2023-02-23 ENCOUNTER — Encounter (HOSPITAL_BASED_OUTPATIENT_CLINIC_OR_DEPARTMENT_OTHER): Payer: Self-pay | Admitting: Emergency Medicine

## 2023-02-23 DIAGNOSIS — M25471 Effusion, right ankle: Secondary | ICD-10-CM | POA: Diagnosis present

## 2023-02-23 DIAGNOSIS — X58XXXD Exposure to other specified factors, subsequent encounter: Secondary | ICD-10-CM | POA: Diagnosis not present

## 2023-02-23 DIAGNOSIS — I10 Essential (primary) hypertension: Secondary | ICD-10-CM | POA: Insufficient documentation

## 2023-02-23 DIAGNOSIS — S93401D Sprain of unspecified ligament of right ankle, subsequent encounter: Secondary | ICD-10-CM | POA: Insufficient documentation

## 2023-02-23 NOTE — ED Provider Notes (Signed)
 [facility] Provider Note   CSN: [id number] Arrival date & time: 02/23/23  1900     History  Chief Complaint  Patient presents with   Abnormal Lab   Leg Swelling    Kelli Barnett is a 48 y.o. female.  The history is provided by the patient.  Abnormal Lab Kelli Barnett is a 48 y.o. female who presents to the Emergency Department complaining of abnormal lab.  She presents to the emergency department for evaluation of elevated D-dimer on outpatient lab.  Last Saturday she had a fall and injured her ankle.  She presented to urgent care today due to persistent swelling in that area.  At urgent care plain films were obtained, which she was told was negative and a D-dimer was obtained, which was elevated and she was referred to the ED for further evaluation.  No chest pain, shortness of breath, fevers.  She does have an ankle brace available to wear.  Hx/o HTN     Home Medications Prior to Admission medications   Medication Sig Start Date End Date Taking? Authorizing Provider  albuterol  (PROAIR  HFA) 108 (90 Base) MCG/ACT inhaler Inhale 2 puffs into the lungs every 4 (four) hours as needed for wheezing or shortness of breath. 05/08/19   Bardelas, Aloysius LABOR, MD  ALPRAZolam (XANAX) 1 MG tablet TAKE 1 TABLET BY MOUTH DAILY AS NEEDED FOR ANXIETY 11/17/16   [provider]  azelastine  (ASTELIN ) 0.1 % nasal spray 2 sprays per nostril twice daily for runny nose as needed for runny nose or sinus headache 06/13/19   Cari Arlean HERO, FNP  Blood Glucose Monitoring Suppl (FIFTY50 GLUCOSE METER 2.0) w/Device KIT Use as instructed 06/10/19   [provider]  Calcium-Magnesium-Vitamin D (CALCIUM 500 PO) Take 500 mg by mouth daily. When she remembers    [provider]  cetirizine  (ZYRTEC ) 10 MG tablet One tablet once a day if needed for runny nose or itchy eyes. 05/08/19   Asa Aloysius LABOR, MD  chlorhexidine  (PERIDEX) 0.12 % solution 15  mLs 2 (two) times daily. 04/22/19   [provider]  Cholecalciferol 1.25 MG (50000 UT) capsule  05/20/19   [provider]  divalproex (DEPAKOTE ER) 500 MG 24 hr tablet Take 2,000 mg by mouth daily with supper.     [provider]  EPINEPHrine  (AUVI-Q ) 0.3 mg/0.3 mL IJ SOAJ injection Use as directed for severe allergic reactions 02/08/17   Bobbitt, Elgin Pepper, MD  fluticasone  (FLONASE ) 50 MCG/ACT nasal spray Two sprays each nostril once a day as needed for nasal congestion or drainage. 05/08/19   Asa Aloysius LABOR, MD  fluticasone  (FLONASE ) 50 MCG/ACT nasal spray 2 sprays once daily as needed for stuffy nose. 06/13/19   Cari Arlean HERO, FNP  fluticasone  (FLOVENT  HFA) 110 MCG/ACT inhaler Two puffs with spacer twice a day to prevent cough or wheeze. Patient taking differently: Inhale 2 puffs into the lungs as needed. Two puffs with spacer twice a day to prevent cough or wheeze. 05/08/19   Asa Aloysius LABOR, MD  lamoTRIgine (LAMICTAL) 150 MG tablet Take 150 mg by mouth daily. 05/02/19   [provider]  meloxicam (MOBIC) 15 MG tablet Take 15 mg by mouth daily. 03/20/19   [provider]  metaxalone (SKELAXIN) 800 MG tablet Take 800 mg by mouth 3 (three) times daily. 04/29/19   [provider]  montelukast  (SINGULAIR ) 10 MG tablet Take 1 tablet (10 mg total) by mouth  at bedtime. 05/08/19   Asa Aloysius LABOR, MD  Multiple Vitamin (MULTI-VITAMIN) tablet Take by mouth.    [provider]  Multiple Vitamins-Minerals (MULTIVITAMIN ADULT PO) Take by mouth.    [provider]  omeprazole  (PRILOSEC) 20 MG capsule Take 1 capsule (20 mg total) by mouth 2 (two) times daily before a meal. 06/13/19   Ambs, Arlean HERO, FNP  omeprazole  (PRILOSEC) 20 MG capsule Take 1 capsule twice daily for reflux 06/13/19   Cari Arlean HERO, FNP  pramipexole (MIRAPEX) 1.5 MG tablet Take 1.5 mg by mouth at bedtime.    [provider]  QUEtiapine (SEROQUEL) 200 MG tablet Take 600  mg by mouth at bedtime. Pt. Takes 3 tablets at night.     [provider]  terbinafine (LAMISIL) 250 MG tablet Take 250 mg by mouth daily. 04/29/19   [provider]  ZOLMitriptan (ZOMIG) 2.5 MG tablet  02/17/19   [provider]      Allergies    Tussionex pennkinetic er [hydrocod poli-chlorphe poli er] and Naproxen    Review of Systems   Review of Systems  All other systems reviewed and are negative.   Physical Exam Updated Vital Signs BP 118/73   Pulse 68   Temp 97.6 F (36.4 C) (Oral)   Resp 16   Ht 5' 9 (1.753 m)   Wt 120.7 kg   SpO2 96%   BMI 39.28 kg/m  Physical Exam Vitals and nursing note reviewed.  Constitutional:      Appearance: She is well-developed.  HENT:     Head: Normocephalic and atraumatic.  Cardiovascular:     Rate and Rhythm: Normal rate and regular rhythm.  Pulmonary:     Effort: Pulmonary effort is normal. No respiratory distress.  Musculoskeletal:     Comments: Soft tissue swelling throughout the right ankle and foot with ecchymosis laterally and to the distal foot.  TTP diffusely, greatest over the lateral malleolus.  Skin:    General: Skin is warm and dry.  Neurological:     Mental Status: She is alert and oriented to person, place, and time.  Psychiatric:        Behavior: Behavior normal.     ED Results / Procedures / Treatments   Labs (all labs ordered are listed, but only abnormal results are displayed) Labs Reviewed - No data to display  EKG None  Radiology US  Venous Img Lower Unilateral Right Result Date: 02/23/2023 CLINICAL DATA:  Right calf pain and swelling.  Fall 1 week ago. EXAM: RIGHT LOWER EXTREMITY VENOUS DOPPLER ULTRASOUND TECHNIQUE: Gray-scale sonography with compression, as well as color and duplex ultrasound, were performed to evaluate the deep venous system(s) from the level of the common femoral vein through the popliteal and proximal calf veins. COMPARISON:  None Available. FINDINGS: VENOUS  Normal compressibility of the common femoral, superficial femoral, and popliteal veins, as well as the visualized calf veins. Visualized portions of profunda femoral vein and great saphenous vein unremarkable. No filling defects to suggest DVT on grayscale or color Doppler imaging. Doppler waveforms show normal direction of venous flow, normal respiratory plasticity and response to augmentation. Limited views of the contralateral common femoral vein are unremarkable. OTHER Fluid collection is noted in the popliteal fossa measuring 2.4 x 0.9 x 2.3 cm. Limitations: none IMPRESSION: 1. No evidence of deep venous thrombosis. 2. Small fluid collection in the popliteal fossa, suggesting Baker's cyst. Electronically Signed   By: Leita Birmingham M.D.   On: 02/23/2023 20:12  Procedures Procedures    Medications Ordered in ED Medications - No data to display  ED Course/ Medical Decision Making/ A&P                                 Medical Decision Making  Patient with history of hypertension here for evaluation of elevated D-dimer on outpatient labs.  She did have plain films per patient at the facility that were negative for acute fracture or dislocation.  On examination she has soft tissue swelling throughout the ankle and foot with ecchymosis to the lateral malleolus and the distal foot.  No evidence of cellulitis.  Vascular ultrasound does demonstrate a Baker's cyst.  She is negative for DVT.  Discussed with patient home care for ankle sprain.  Recommend that she continue wearing ankle brace with activity as tolerated.  Discussed following up with her orthopedic physician.          Final Clinical Impression(s) / ED Diagnoses Final diagnoses:  Sprain of right ankle, unspecified ligament, subsequent encounter  Edema of right ankle    Rx / DC Orders ED Discharge Orders     None         Griselda Norris, MD 02/24/23 [phone]

## 2023-02-23 NOTE — ED Triage Notes (Addendum)
 PtPOV c/o R foot swelling after fall Saturday, seen at [facility] and had + d-dimer today, sent here to rule out blood clot. Intermittent parathesia of R foot, discoloration.   Recent travel to [place], [place] and [place] in mid Jan.

## 2023-05-16 HISTORY — PX: ANKLE SURGERY: SHX546

## 2023-11-28 ENCOUNTER — Encounter: Admitting: Family Medicine

## 2024-01-04 ENCOUNTER — Encounter: Admitting: Obstetrics & Gynecology

## 2024-01-10 ENCOUNTER — Emergency Department (HOSPITAL_BASED_OUTPATIENT_CLINIC_OR_DEPARTMENT_OTHER)
Admission: EM | Admit: 2024-01-10 | Discharge: 2024-01-10 | Disposition: A | Discharge status: Home / Self Care | Attending: Emergency Medicine | Admitting: Emergency Medicine

## 2024-01-10 ENCOUNTER — Encounter (HOSPITAL_BASED_OUTPATIENT_CLINIC_OR_DEPARTMENT_OTHER): Payer: Self-pay | Admitting: Emergency Medicine

## 2024-01-10 ENCOUNTER — Emergency Department (HOSPITAL_BASED_OUTPATIENT_CLINIC_OR_DEPARTMENT_OTHER)

## 2024-01-10 ENCOUNTER — Other Ambulatory Visit: Payer: Self-pay

## 2024-01-10 DIAGNOSIS — W108XXA Fall (on) (from) other stairs and steps, initial encounter: Secondary | ICD-10-CM | POA: Insufficient documentation

## 2024-01-10 DIAGNOSIS — M79644 Pain in right finger(s): Secondary | ICD-10-CM | POA: Insufficient documentation

## 2024-01-10 DIAGNOSIS — Y92019 Unspecified place in single-family (private) house as the place of occurrence of the external cause: Secondary | ICD-10-CM | POA: Diagnosis not present

## 2024-01-10 DIAGNOSIS — S62524A Nondisplaced fracture of distal phalanx of right thumb, initial encounter for closed fracture: Secondary | ICD-10-CM | POA: Diagnosis not present

## 2024-01-10 DIAGNOSIS — S6991XA Unspecified injury of right wrist, hand and finger(s), initial encounter: Secondary | ICD-10-CM | POA: Diagnosis present

## 2024-01-10 NOTE — ED Provider Notes (Signed)
 " [place] [facility] Provider Note   CSN: [id number] Arrival date & time: 01/10/24  1622     Patient presents with: [place]   Kelli Barnett is a 48 y.o. female.   Patient is a 48 year old female presenting today after a fall last night with ongoing pain in her right thumb.  She tripped going up the stairs and caught herself on her hand.  Denies injury anywhere else.  No head injury, loss of consciousness or other complaints.  The history is provided by the patient.  Fall       Prior to Admission medications  Medication Sig Start Date End Date Taking? Authorizing Provider  albuterol  (PROAIR  HFA) 108 (90 Base) MCG/ACT inhaler Inhale 2 puffs into the lungs every 4 (four) hours as needed for wheezing or shortness of breath. 05/08/19   Bardelas, Aloysius LABOR, MD  ALPRAZolam (XANAX) 1 MG tablet TAKE 1 TABLET BY MOUTH DAILY AS NEEDED FOR ANXIETY 11/17/16   [provider]  azelastine  (ASTELIN ) 0.1 % nasal spray 2 sprays per nostril twice daily for runny nose as needed for runny nose or sinus headache 06/13/19   Cari Arlean HERO, FNP  Blood Glucose Monitoring Suppl (FIFTY50 GLUCOSE METER 2.0) w/Device KIT Use as instructed 06/10/19   [provider]  Calcium-Magnesium-Vitamin D (CALCIUM 500 PO) Take 500 mg by mouth daily. When she remembers    [provider]  cetirizine  (ZYRTEC ) 10 MG tablet One tablet once a day if needed for runny nose or itchy eyes. 05/08/19   Asa Aloysius LABOR, MD  chlorhexidine  (PERIDEX) 0.12 % solution 15 mLs 2 (two) times daily. 04/22/19   [provider]  Cholecalciferol 1.25 MG (50000 UT) capsule  05/20/19   [provider]  divalproex (DEPAKOTE ER) 500 MG 24 hr tablet Take 2,000 mg by mouth daily with supper.     [provider]  EPINEPHrine  (AUVI-Q ) 0.3 mg/0.3 mL IJ SOAJ injection Use as directed for severe allergic reactions 02/08/17   Bobbitt, Elgin Pepper, MD  fluticasone  (FLONASE ) 50  MCG/ACT nasal spray Two sprays each nostril once a day as needed for nasal congestion or drainage. 05/08/19   Asa Aloysius LABOR, MD  fluticasone  (FLONASE ) 50 MCG/ACT nasal spray 2 sprays once daily as needed for stuffy nose. 06/13/19   Cari Arlean HERO, FNP  fluticasone  (FLOVENT  HFA) 110 MCG/ACT inhaler Two puffs with spacer twice a day to prevent cough or wheeze. Patient taking differently: Inhale 2 puffs into the lungs as needed. Two puffs with spacer twice a day to prevent cough or wheeze. 05/08/19   Asa Aloysius LABOR, MD  lamoTRIgine (LAMICTAL) 150 MG tablet Take 150 mg by mouth daily. 05/02/19   [provider]  meloxicam (MOBIC) 15 MG tablet Take 15 mg by mouth daily. 03/20/19   [provider]  metaxalone (SKELAXIN) 800 MG tablet Take 800 mg by mouth 3 (three) times daily. 04/29/19   [provider]  montelukast  (SINGULAIR ) 10 MG tablet Take 1 tablet (10 mg total) by mouth at bedtime. 05/08/19   Asa Aloysius LABOR, MD  Multiple Vitamin (MULTI-VITAMIN) tablet Take by mouth.    [provider]  Multiple Vitamins-Minerals (MULTIVITAMIN ADULT PO) Take by mouth.    [provider]  omeprazole  (PRILOSEC) 20 MG capsule Take 1 capsule (20 mg total) by mouth 2 (two) times daily before a meal. 06/13/19   Ambs, Arlean HERO, FNP  omeprazole  (PRILOSEC) 20 MG capsule Take 1 capsule twice  daily for reflux 06/13/19   Cari Arlean HERO, FNP  pramipexole (MIRAPEX) 1.5 MG tablet Take 1.5 mg by mouth at bedtime.    [provider]  QUEtiapine (SEROQUEL) 200 MG tablet Take 600 mg by mouth at bedtime. Pt. Takes 3 tablets at night.     [provider]  terbinafine (LAMISIL) 250 MG tablet Take 250 mg by mouth daily. 04/29/19   [provider]  ZOLMitriptan (ZOMIG) 2.5 MG tablet  02/17/19   [provider]    Allergies: Tussionex pennkinetic er [hydrocod poli-chlorphe poli er] and Naproxen    Review of Systems  Updated Vital Signs BP 137/79 (BP Location: Left  Arm)   Pulse 64   Temp (!) 97.5 F (36.4 C)   Resp 18   Ht 5' 9 (1.753 m)   Wt 111.6 kg   SpO2 99%   BMI 36.33 kg/m   Physical Exam Vitals and nursing note reviewed.  HENT:     Head: Normocephalic.  Pulmonary:     Effort: Pulmonary effort is normal.  Musculoskeletal:        General: Tenderness present.       Hands:  Neurological:     Mental Status: She is alert.     (all labs ordered are listed, but only abnormal results are displayed) Labs Reviewed - No data to display  EKG: None  Radiology: DG Finger Thumb Right Result Date: 01/10/2024 CLINICAL DATA:  Thumb swelling EXAM: RIGHT THUMB 2+V COMPARISON:  None Available. FINDINGS: Possible tiny intra-articular fracture base of the first distal phalanx. Soft tissue swelling at the IP joint. No malalignment. IMPRESSION: Possible tiny intra-articular fracture base of the first distal phalanx. Electronically Signed   By: Luke Bun M.D.   On: 01/10/2024 17:03     Procedures   Medications Ordered in the ED - No data to display                                  Medical Decision Making Amount and/or Complexity of Data Reviewed Radiology: ordered and independent interpretation performed. Decision-making details documented in ED Course.   Patient presenting today after a fall at home and injury to the right thumb.  Significant swelling and inability to bend at the PIP joint.  No other areas of injury.  No snuffbox tenderness noted on exam or any evidence of wrist injury.  I have independently visualized and interpreted pt's images today.  X-ray shows small fracture of the distal phalanx.  Radiology reports possible tiny intra-articular fracture at the base of the first distal phalanx.  Patient placed in a finger splint.  Will have her follow-up with orthopedics if once the swelling goes down she does not have full range of motion of her thumb.       Final diagnoses:  Nondisplaced fracture of distal phalanx of right  thumb, initial encounter for closed fracture    ED Discharge Orders     None          Doretha Folks, MD 01/10/24 1713  "

## 2024-01-10 NOTE — ED Triage Notes (Signed)
 Pt reports mechanical fall last night, c/o R thumb swelling, numbness, and difficulty with movement. Also reports landing on R knee, bruise to L forearm.   Denies head injury, LOC, blood thinners.

## 2024-02-06 ENCOUNTER — Other Ambulatory Visit (HOSPITAL_COMMUNITY)
Admission: RE | Admit: 2024-02-06 | Discharge: 2024-02-06 | Disposition: A | Source: Ambulatory Visit | Attending: Family Medicine | Admitting: Family Medicine

## 2024-02-06 ENCOUNTER — Ambulatory Visit: Admitting: Family Medicine

## 2024-02-06 ENCOUNTER — Encounter: Payer: Self-pay | Admitting: Family Medicine

## 2024-02-06 VITALS — BP 138/88 | HR 67 | Ht 69.0 in | Wt 251.1 lb

## 2024-02-06 DIAGNOSIS — N951 Menopausal and female climacteric states: Secondary | ICD-10-CM | POA: Diagnosis not present

## 2024-02-06 DIAGNOSIS — Z01419 Encounter for gynecological examination (general) (routine) without abnormal findings: Secondary | ICD-10-CM | POA: Insufficient documentation

## 2024-02-06 MED ORDER — ESTRADIOL 0.05 MG/24HR TD PTTW: 1.0000 | MEDICATED_PATCH | TRANSDERMAL | 12 refills | Status: AC

## 2024-02-06 MED ORDER — PROGESTERONE 200 MG PO CAPS: ORAL_CAPSULE | ORAL | 3 refills | Status: AC

## 2024-02-06 NOTE — Progress Notes (Signed)
 "  ANNUAL EXAM Patient name: Kelli Barnett MRN [id number]  Date of birth: [birth year] Chief Complaint:   Annual Exam  History of Present Illness:   Kelli Barnett is a 49 y.o.  [phone]  female  being seen today for a routine annual exam.  Current complaints:  Patient had ablation about 10 years ago and has not had a period since then.  She does complain of hot flashes and night sweats, although this has been improved over the last few months.  She does have a history of several broken bones throughout her lifetime.  She did have a bone density scan close to 10 years ago which was normal.  2 grandmothers with a history of breast cancer.  Patient has no history of breast biopsies.  Started having periods at the age of 15, and had her first child at age of 77.  Does have migraines with auras.   No LMP recorded. Patient has had an ablation.    Last pap 2 years. Results were: NILM w/ HRHPV negative. H/O abnormal pap: no Last mammogram: 2024 . Results were: normal. Family h/o breast cancer: no Last colonoscopy: 2024. Rpt 5-10 years    02/06/2024   10:18 AM  Depression screen PHQ 2/9  Decreased Interest 1  Down, Depressed, Hopeless 1  PHQ - 2 Score 2  Altered sleeping 3  Tired, decreased energy 3  Change in appetite 2  Feeling bad or failure about yourself  1  Trouble concentrating 3  Moving slowly or fidgety/restless 2  Suicidal thoughts 0  PHQ-9 Score 16        02/06/2024   10:19 AM  GAD 7 : Generalized Anxiety Score  Nervous, Anxious, on Edge 3  Control/stop worrying 2  Worry too much - different things 2  Trouble relaxing 3  Restless 1  Easily annoyed or irritable 1  Afraid - awful might happen 3  Total GAD 7 Score 15     Review of Systems:   Pertinent items are noted in HPI Denies any headaches, blurred vision, fatigue, shortness of breath, chest pain, abdominal pain, abnormal vaginal discharge/itching/odor/irritation, problems with periods, bowel movements,  urination, or intercourse unless otherwise stated above. Pertinent History Reviewed:  Reviewed past medical,surgical, social and family history.  Reviewed problem list, medications and allergies. Physical Assessment:   Vitals:   02/06/24 0947  BP: 138/88  Pulse: 67  Weight: 251 lb 1.9 oz (113.9 kg)  Height: 5' 9 (1.753 m)  Body mass index is 37.08 kg/m.        Physical Examination:   General appearance - well appearing, and in no distress  Mental status - alert, oriented to person, place, and time  Psych:  She has a normal mood and affect  Skin - warm and dry, normal color, no suspicious lesions noted  Chest - effort normal, all lung fields clear to auscultation bilaterally  Heart - normal rate and regular rhythm  Neck:  midline trachea, no thyromegaly or nodules  Breasts - breasts appear normal, no suspicious masses, no skin or nipple changes or axillary nodes  Abdomen - soft, nontender, nondistended, no masses or organomegaly  Pelvic - VULVA: normal appearing vulva with no masses, tenderness or lesions  VAGINA: normal appearing vagina with normal color and discharge, no lesions  CERVIX: normal appearing cervix without discharge or lesions, no CMT  Thin prep pap is done with HR HPV cotesting  UTERUS: uterus is felt to be normal size, shape, consistency and  nontender   ADNEXA: No adnexal masses or tenderness noted.  Extremities:  No swelling or varicosities noted  Chaperone present for exam  Assessment & Plan:  1. Well woman exam with routine gynecological exam (Primary) - Cytology - PAP( ) - MM 3D SCREENING MAMMOGRAM BILATERAL BREAST; Future  2. Menopausal syndrome (hot flashes) Discussed HRT.  5-year risk of breast cancer 1.1%.  Lifetime risk 11.1%.  Patient amenable to trial.  Discussed will need to watch migraine frequency and severity risks.  However, the patch is not known to increase thromboembolic disease specific to stroke like oral birth control tablets.   Will prescribe low-dose estrogen patch and progesterone  tablets.  Follow-up in 3 months   No orders of the defined types were placed in this encounter.   Meds: No orders of the defined types were placed in this encounter.   Follow-up: No follow-ups on file.  Kelli Occhipinti J Tiaja Hagan, DO 02/06/2024 10:38 AM "

## 2024-02-08 LAB — CYTOLOGY - PAP
Comment: NEGATIVE
Diagnosis: NEGATIVE
High risk HPV: NEGATIVE

## 2024-02-12 ENCOUNTER — Ambulatory Visit: Payer: Self-pay | Admitting: Family Medicine

## 2024-03-05 ENCOUNTER — Ambulatory Visit (HOSPITAL_BASED_OUTPATIENT_CLINIC_OR_DEPARTMENT_OTHER)

## 2024-05-01 ENCOUNTER — Ambulatory Visit: Admitting: Family Medicine
